# Patient Record
Sex: Female | Born: 1975 | Race: White | Hispanic: No | Marital: Single | Smoking: Former smoker
Health system: Southern US, Community
[De-identification: ages and names within clinical notes are randomized; demographics above are authoritative.]

## PROBLEM LIST (undated history)

## (undated) DIAGNOSIS — N979 Female infertility, unspecified: Secondary | ICD-10-CM

## (undated) DIAGNOSIS — E236 Other disorders of pituitary gland: Secondary | ICD-10-CM

## (undated) DIAGNOSIS — Z889 Allergy status to unspecified drugs, medicaments and biological substances status: Secondary | ICD-10-CM

## (undated) HISTORY — DX: Female infertility, unspecified: N97.9

## (undated) HISTORY — PX: ENDOMETRIAL ABLATION: SHX621

## (undated) HISTORY — DX: Other disorders of pituitary gland: E23.6

---

## 2007-07-31 LAB — CONVERTED CEMR LAB: Pap Smear: NORMAL

## 2008-06-06 ENCOUNTER — Ambulatory Visit: Payer: Self-pay | Admitting: Family Medicine

## 2008-06-06 DIAGNOSIS — F341 Dysthymic disorder: Secondary | ICD-10-CM | POA: Insufficient documentation

## 2010-09-18 ENCOUNTER — Encounter: Payer: Self-pay | Admitting: Family Medicine

## 2010-09-18 ENCOUNTER — Inpatient Hospital Stay (INDEPENDENT_AMBULATORY_CARE_PROVIDER_SITE_OTHER)
Admission: RE | Admit: 2010-09-18 | Discharge: 2010-09-18 | Disposition: A | Discharge status: Home / Self Care | Payer: PRIVATE HEALTH INSURANCE | Source: Ambulatory Visit | Attending: Family Medicine | Admitting: Family Medicine

## 2010-09-18 DIAGNOSIS — R42 Dizziness and giddiness: Secondary | ICD-10-CM

## 2010-09-18 DIAGNOSIS — H6990 Unspecified Eustachian tube disorder, unspecified ear: Secondary | ICD-10-CM

## 2010-09-18 DIAGNOSIS — R5381 Other malaise: Secondary | ICD-10-CM

## 2010-09-18 DIAGNOSIS — B009 Herpesviral infection, unspecified: Secondary | ICD-10-CM | POA: Insufficient documentation

## 2010-09-18 DIAGNOSIS — H6980 Other specified disorders of Eustachian tube, unspecified ear: Secondary | ICD-10-CM

## 2010-09-20 ENCOUNTER — Telehealth (INDEPENDENT_AMBULATORY_CARE_PROVIDER_SITE_OTHER): Payer: Self-pay | Admitting: Emergency Medicine

## 2010-10-30 ENCOUNTER — Encounter: Payer: Self-pay | Admitting: Family Medicine

## 2010-10-30 ENCOUNTER — Inpatient Hospital Stay (INDEPENDENT_AMBULATORY_CARE_PROVIDER_SITE_OTHER)
Admission: RE | Admit: 2010-10-30 | Discharge: 2010-10-30 | Disposition: A | Discharge status: Home / Self Care | Payer: PRIVATE HEALTH INSURANCE | Source: Ambulatory Visit | Attending: Family Medicine | Admitting: Family Medicine

## 2010-10-30 DIAGNOSIS — J069 Acute upper respiratory infection, unspecified: Secondary | ICD-10-CM

## 2011-02-01 NOTE — Telephone Encounter (Signed)
  Phone Note Outgoing Call   Call placed by: Lavell Islam RN,  September 20, 2010 4:26 PM Call placed to: Patient Action Taken: Phone Call Completed Summary of Call: Left message on voice mail inquiring about patient's status and encouraging her to call with any questions/concerns. Initial call taken by: Lavell Islam RN,  September 20, 2010 4:27 PM

## 2011-02-01 NOTE — Progress Notes (Signed)
Summary: EAR ACHE/CONGESTION   Vital Signs:  Patient Profile:   35 Years Old Female CC:      right ear pain, sinus pressure x 4 days Height:     70 inches Weight:      155 pounds O2 Sat:      99 % O2 treatment:    Room Air Temp:     98.4 degrees F oral Pulse rate:   65 / minute Resp:     12 per minute BP sitting:   109 / 75  (left arm) Cuff size:   regular  Vitals Entered By: Lajean Saver RN (September 18, 2010 9:27 AM)                  Updated Prior Medication List: PROVERA 2.5 MG TABS (MEDROXYPROGESTERONE ACETATE)   Current Allergies: No known allergies History of Present Illness Chief Complaint: right ear pain, sinus pressure x 4 days History of Present Illness:  Subjective: Patient complains of vague sensation of right ear being clogged for 3 days, associated with a mild sensation of being off-balance.  Symptoms have not improved with a Neti pot.  Also complains of recurrent cold sores for the past two days.  She has had a poor response in past to Acyclovir cream No sore throat No cough No pleuritic pain No wheezing + mild right nasal congestion No post-nasal drainage ? sinus pain/pressure No itchy/red eyes No earache No hemoptysis No SOB No fever/chills No nausea No vomiting No abdominal pain No diarrhea No skin rashes + fatigue + myalgias + arthralgias.  No history of tick bite. No headache    REVIEW OF SYSTEMS Constitutional Symptoms      Denies fever, chills, night sweats, weight loss, weight gain, and fatigue.  Eyes       Denies change in vision, eye pain, eye discharge, glasses, contact lenses, and eye surgery. Ear/Nose/Throat/Mouth       Complains of ear pain and sinus problems.      Denies hearing loss/aids, change in hearing, ear discharge, dizziness, frequent runny nose, frequent nose bleeds, sore throat, hoarseness, and tooth pain or bleeding.  Respiratory       Denies dry cough, productive cough, wheezing, shortness of breath, asthma,  bronchitis, and emphysema/COPD.  Cardiovascular       Denies murmurs, chest pain, and tires easily with exhertion.    Gastrointestinal       Denies stomach pain, nausea/vomiting, diarrhea, constipation, blood in bowel movements, and indigestion. Genitourniary       Denies painful urination, blood or discharge from vagina, kidney stones, and loss of urinary control. Neurological       Denies paralysis, seizures, and fainting/blackouts. Musculoskeletal       Denies muscle pain, joint pain, joint stiffness, decreased range of motion, redness, swelling, muscle weakness, and gout.  Skin       Denies bruising, unusual mles/lumps or sores, and hair/skin or nail changes.  Psych       Denies mood changes, temper/anger issues, anxiety/stress, speech problems, depression, and sleep problems. Other Comments: Patient c/o rigth ear pain, sinus pressure and feeling "swimmy headed" x 4 days. She has used neti pot, advil and claritin OTC.   Past History:  Past Medical History: Reviewed history from 06/06/2008 and no changes required. None  Past Surgical History: Reviewed history from 06/06/2008 and no changes required. None  Family History: Reviewed history from 06/06/2008 and no changes required. 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  Lives with her partner and her twin sister.  Denies smoking Alcohol use-yes Drug use-no Regular exercise-yes   Objective:  Appearance:  Patient appears healthy, stated age, and in no acute distress  Eyes:  Pupils are equal, round, and reactive to light and accomodation.  Extraocular movement is intact.  Conjunctivae are not inflamed.  Ears:  Canals normal.  Tympanic membranes normal.   Nose:  Mildly congested turbinates.  No sinus tenderness  Mouth::  Lower lips reveal early cold sore lesions;  moist mucous membranes  Pharynx:  Normal  Neck:  Supple.  Slightly tender shotty anterior/posterior nodes are palpated  bilaterally.  Lungs:  Clear to auscultation.  Breath sounds are equal.  Heart:  Regular rate and rhythm without murmurs, rubs, or gallops.  Abdomen:  Nontender without masses or hepatosplenomegaly.  Bowel sounds are present.  No CVA or flank tenderness.  Extremities:  No edema.   Skin:  No rash Tympanogram normal bilaterally CBC:  WBC 7.0 ; LY 33.5, MO 7.9, GR 58.6; Hgb 12.9  . 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) FATIGUE, ACUTE (ICD-780.79) DIZZINESS (ICD-780.4)  SUSPECT EARLY VIRAL URI WITH MILD VERTIGO, EUSTACHIAN TUBE DYSFUNCTION, AND HERPES LABIALIS  Plan New Medications/Changes: VALTREX 1 GM TABS (VALACYCLOVIR HCL) 2 tabs by mouth q12hr X 1 day at first sign of cold sores  #4 x 3, 09/18/2010, Donna Christen MD MECLIZINE HCL 25 MG TABS (MECLIZINE HCL) One by mouth two times a day to three times a day as needed for dizziness  #15 x 1, 09/18/2010, Donna Christen MD  New Orders: CBC w/Diff [[address]-60454] Tympanometry [09811] New Patient Level IV [91478] Planning Comments:   Treat symptomatically for now:  rest, increased fluids, topical decongestant, Antivert as needed.  Continue saline nasal irrigation with a Neti pot. Valtrex for one day. Follow-up with PCP if not improving 5 to 7 days.   The patient and/or caregiver has been counseled thoroughly with regard to medications prescribed including dosage, schedule, interactions, rationale for use, and possible side effects and they verbalize understanding.  Diagnoses and expected course of recovery discussed and will return if not improved as expected or if the condition worsens. Patient and/or caregiver verbalized understanding.  Prescriptions: VALTREX 1 GM TABS (VALACYCLOVIR HCL) 2 tabs by mouth q12hr X 1 day at first sign of cold sores  #4 x 3   Entered and Authorized by:   Donna Christen MD   Signed by:   Donna Christen MD on 09/18/2010   Method used:   Print then Give  to Patient   RxID:   [id number] MECLIZINE HCL 25 MG TABS (MECLIZINE HCL) One by mouth two times a day to three times a day as needed for dizziness  #15 x 1   Entered and Authorized by:   Donna Christen MD   Signed by:   Donna Christen MD on 09/18/2010   Method used:   Print then Give to Patient   RxID:   [id number]   Orders Added: 1)  CBC w/Diff [72536-64403] 2)  Tympanometry [47425] 3)  New Patient Level IV [95638]

## 2011-02-01 NOTE — Progress Notes (Signed)
Summary: Possible URI and ear pain Room 5   Vital Signs:  Patient Profile:   35 Years Old Female CC:      Congestion, productive cough headache, sore throat x 2 days Height:     70 inches Weight:      159 pounds O2 Sat:      99 % O2 treatment:    Room Air Temp:     99.1 degrees F oral Pulse rate:   62 / minute Pulse rhythm:   regular Resp:     14 per minute BP sitting:   132 / 76  (left arm) Cuff size:   regular  Vitals Entered By: Emilio Math (October 30, 2010 11:34 AM)                  Current Allergies: No known allergies History of Present Illness Chief Complaint: Congestion, productive cough headache, sore throat x 2 days History of Present Illness:  Subjective: Patient complains of URI symptoms for 3 to 4 days. + mild sore throat + cough No pleuritic pain No wheezing + nasal congestion ? post-nasal drainage No sinus pain/pressure No itchy/red eyes No earache No hemoptysis No SOB No fever/chills No nausea No vomiting No abdominal pain No diarrhea No skin rashes + fatigue No myalgias + headache     Current Meds PROVERA 2.5 MG TABS (MEDROXYPROGESTERONE ACETATE)  ZICAM COLD REMEDY  TBDP (HOMEOPATHIC PRODUCTS)  PREDNISONE 10 MG TABS (PREDNISONE) 2 PO BID for 2 days, then 1 BID for 2 days, then 1 daily for 2 days.  Take PC AZITHROMYCIN 250 MG TABS (AZITHROMYCIN) Two tabs by mouth on day 1, then 1 tab daily on days 2 through 5 (Rx void after 11/06/10) BENZONATATE 200 MG CAPS (BENZONATATE) One by mouth hs as needed cough  REVIEW OF SYSTEMS Constitutional Symptoms      Denies fever, chills, night sweats, weight loss, weight gain, and fatigue.  Eyes       Denies change in vision, eye pain, eye discharge, glasses, contact lenses, and eye surgery. Ear/Nose/Throat/Mouth       Complains of frequent runny nose, sinus problems, sore throat, and hoarseness.      Denies hearing loss/aids, change in hearing, ear pain, ear discharge, dizziness, frequent nose  bleeds, and tooth pain or bleeding.  Respiratory       Complains of productive cough.      Denies dry cough, wheezing, shortness of breath, asthma, bronchitis, and emphysema/COPD.  Cardiovascular       Denies murmurs, chest pain, and tires easily with exhertion.    Gastrointestinal       Denies stomach pain, nausea/vomiting, diarrhea, constipation, blood in bowel movements, and indigestion. Genitourniary       Denies painful urination, kidney stones, and loss of urinary control. Neurological       Complains of headaches.      Denies paralysis, seizures, and fainting/blackouts. Musculoskeletal       Denies muscle pain, joint pain, joint stiffness, decreased range of motion, redness, swelling, muscle weakness, and gout.  Skin       Denies bruising, unusual mles/lumps or sores, and hair/skin or nail changes.  Psych       Denies mood changes, temper/anger issues, anxiety/stress, speech problems, depression, and sleep problems.  Past History:  Past Medical History: Reviewed history from 06/06/2008 and no changes required. None  Past Surgical History: Reviewed history from 06/06/2008 and no changes required. None  Family History: Reviewed history from 06/06/2008 and no  changes required. None  Social History: Reviewed history from 09/18/2010 and no changes required. 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  Lives with her partner and her twin sister.  Denies smoking Alcohol use-yes Drug use-no Regular exercise-yes   Objective:  Appearance:  Patient appears healthy, stated age, and in no acute distress  Eyes:  Pupils are equal, round, and reactive to light and accomodation.  Extraocular movement is intact.  Conjunctivae are not inflamed.  Ears:  Canals normal.  Tympanic membranes normal.   Nose:  Mildly congested turbinates.  No sinus tenderness  Pharynx:  Normal  Neck:  Supple.  Slightly tender shotty posterior nodes are palpated bilaterally.  Lungs:  Clear to  auscultation.  Breath sounds are equal.  Heart:  Regular rate and rhythm without murmurs, rubs, or gallops.  Abdomen:  Nontender without masses or hepatosplenomegaly.  Bowel sounds are present.  No CVA or flank tenderness.  Skin:  No rash Assessment New Problems: UPPER RESPIRATORY INFECTION, ACUTE (ICD-465.9)   Plan New Medications/Changes: BENZONATATE 200 MG CAPS (BENZONATATE) One by mouth hs as needed cough  #12 x 0, 10/30/2010, Donna Christen MD AZITHROMYCIN 250 MG TABS (AZITHROMYCIN) Two tabs by mouth on day 1, then 1 tab daily on days 2 through 5 (Rx void after 11/06/10)  #6 tabs x 0, 10/30/2010, Donna Christen MD PREDNISONE 10 MG TABS (PREDNISONE) 2 PO BID for 2 days, then 1 BID for 2 days, then 1 daily for 2 days.  Take PC  #14 x 0, 10/30/2010, Donna Christen MD  New Orders: Pulse Oximetry (single measurment) [94760] Est. Patient Level III [40981] Planning Comments:   Treat symptomatically for now:  Increase fluid intake, begin expectorant/decongestant, topical decongestant,  cough suppressant at bedtime.  If fever/chills/sweats persist, or if not improving 5  days begin Z-pack (given Rx to hold).  Followup with PCP if not improving 7 to 10 days.   The patient and/or caregiver has been counseled thoroughly with regard to medications prescribed including dosage, schedule, interactions, rationale for use, and possible side effects and they verbalize understanding.  Diagnoses and expected course of recovery discussed and will return if not improved as expected or if the condition worsens. Patient and/or caregiver verbalized understanding.  Prescriptions: BENZONATATE 200 MG CAPS (BENZONATATE) One by mouth hs as needed cough  #12 x 0   Entered and Authorized by:   Donna Christen MD   Signed by:   Donna Christen MD on 10/30/2010   Method used:   Print then Give to Patient   RxID:   [id number] AZITHROMYCIN 250 MG TABS (AZITHROMYCIN) Two tabs by mouth on day 1, then 1 tab daily on days 2  through 5 (Rx void after 11/06/10)  #6 tabs x 0   Entered and Authorized by:   Donna Christen MD   Signed by:   Donna Christen MD on 10/30/2010   Method used:   Print then Give to Patient   RxID:   [id number] PREDNISONE 10 MG TABS (PREDNISONE) 2 PO BID for 2 days, then 1 BID for 2 days, then 1 daily for 2 days.  Take PC  #14 x 0   Entered and Authorized by:   Donna Christen MD   Signed by:   Donna Christen MD on 10/30/2010   Method used:   Print then Give to Patient   RxID:   [id number]   Patient Instructions: 1)  Take Mucinex D (guaifenesin with decongestant) twice daily for congestion. 2)  Increase fluid intake,  rest. 3)  May use Afrin nasal spray (or generic oxymetazoline) twice daily for about 5 days.  Also recommend using saline nasal spray several times daily and/or saline nasal irrigation. 4)  Begin Azithromycin if not improving about 5 days or if persistent fever develops. 5)  Followup with family doctor if not improving 7 to 10 days.   Orders Added: 1)  Pulse Oximetry (single measurment) [94760] 2)  Est. Patient Level III [[address]]

## 2011-02-01 NOTE — Letter (Signed)
Summary: Out of Work  [facility]  [address]   [place], [place] [address]   Phone: [phone]  Fax: [phone]    September 18, 2010   Employee:  NICOSHA STRUVE    To Whom It May Concern:   For Medical reasons, please excuse the above named employee from work 09/19/10 and 09/20/10.    If you need additional information, please feel free to contact our office.         Sincerely,    Donna Christen MD

## 2011-02-24 ENCOUNTER — Emergency Department
Admission: EM | Admit: 2011-02-24 | Discharge: 2011-02-24 | Disposition: A | Discharge status: Home / Self Care | Payer: PRIVATE HEALTH INSURANCE | Source: Home / Self Care | Attending: Emergency Medicine | Admitting: Emergency Medicine

## 2011-02-24 ENCOUNTER — Encounter: Payer: Self-pay | Admitting: Emergency Medicine

## 2011-02-24 DIAGNOSIS — A09 Infectious gastroenteritis and colitis, unspecified: Secondary | ICD-10-CM

## 2011-02-24 MED ORDER — CIPROFLOXACIN HCL 250 MG PO TABS: 250.0000 mg | ORAL_TABLET | Freq: Two times a day (BID) | ORAL | Status: AC

## 2011-02-24 NOTE — ED Provider Notes (Signed)
History     CSN: [id number]  Arrival date & time 02/24/11  1238   First MD Initiated Contact with Patient 02/24/11 1434      Chief Complaint  Patient presents with  . Generalized Body Aches     Patient is a 35 y.o. female presenting with diarrhea. The history is provided by the patient.  Diarrhea The primary symptoms include fatigue, nausea and diarrhea (4-6 times a day, some mucus in the diarrhea, but no blood). Primary symptoms do not include fever, weight loss, vomiting, melena, hematemesis, jaundice, hematochezia, dysuria, myalgias, arthralgias or rash. Abdominal pain: Minimal, generalized. The illness began 3 to 5 days ago. The onset was gradual. The problem has been gradually worsening.  The illness is also significant for anorexia. The illness does not include chills, dysphagia, odynophagia, bloating, constipation, tenesmus, back pain or itching. Associated medical issues do not include inflammatory bowel disease, GERD, gallstones, liver disease, alcohol abuse, PUD, bowel resection, irritable bowel syndrome or diverticulitis. Risk factors: No recent travel out of the state, that has eaten various foods during the holiday season.    History reviewed. No pertinent past medical history.  No past surgical history on file.  No family history on file.  History  Substance Use Topics  . Smoking status: Not on file  . Smokeless tobacco: Not on file  . Alcohol Use: Not on file    OB History    Grav Para Term Preterm Abortions TAB SAB Ect Mult Living                  Review of Systems  Constitutional: Positive for fatigue. Negative for fever, chills and weight loss.  HENT: Negative.   Eyes: Negative.   Respiratory: Negative.   Cardiovascular: Negative.   Gastrointestinal: Positive for nausea, diarrhea (4-6 times a day, some mucus in the diarrhea, but no blood) and anorexia. Negative for dysphagia, vomiting, constipation, melena, hematochezia, bloating, hematemesis and  jaundice. Abdominal pain: Minimal, generalized.  Genitourinary: Negative.  Negative for dysuria.  Musculoskeletal: Negative for myalgias, back pain and arthralgias.  Skin: Negative.  Negative for itching and rash.  Neurological: Negative for tremors, syncope, weakness, light-headedness and headaches.  Hematological: Negative.   Psychiatric/Behavioral: Negative.     Allergies  Review of patient's allergies indicates no known allergies.  Home Medications   Current Outpatient Rx  Name Route Sig Dispense Refill  . MEDROXYPROGESTERONE ACETATE 10 MG PO TABS Oral Take 10 mg by mouth daily.      Marland Kitchen CIPROFLOXACIN HCL 250 MG PO TABS Oral Take 1 tablet (250 mg total) by mouth 2 (two) times daily. 14 tablet 0    BP 123/83  Pulse 63  Temp(Src) 98.6 F (37 C) (Oral)  Resp 12  Ht 5\' 7"  (1.702 m)  Wt 159 lb (72.122 kg)  BMI 24.90 kg/m2  SpO2 99%  Physical Exam  Nursing note and vitals reviewed. Constitutional: She is oriented to person, place, and time. She appears well-developed and well-nourished. No distress.       Appears fatigued, but not toxic appearing  HENT:  Head: Normocephalic and atraumatic.  Eyes: Conjunctivae and EOM are normal. Pupils are equal, round, and reactive to light. No scleral icterus.  Neck: Normal range of motion. Neck supple. No JVD present.  Cardiovascular: Normal rate and regular rhythm.  Exam reveals no gallop and no friction rub.   No murmur heard. Pulmonary/Chest: Effort normal and breath sounds normal. No respiratory distress. She has no wheezes. She has no  rales.  Abdominal: Normal appearance. She exhibits no shifting dullness, no distension, no pulsatile liver, no fluid wave, no abdominal bruit, no ascites and no mass. Bowel sounds are increased. There is no hepatosplenomegaly. There is tenderness (Very minimal generalized tenderness). There is no rigidity, no rebound, no guarding, no CVA tenderness, no tenderness at McBurney's point and negative Murphy's  sign.  Musculoskeletal: Normal range of motion. She exhibits no edema.  Lymphadenopathy:    She has no cervical adenopathy.  Neurological: She is alert and oriented to person, place, and time.  Skin: Skin is warm. No rash noted.  Psychiatric: She has a normal mood and affect. Her behavior is normal.    ED Course  Procedures (including critical care time)  Labs Reviewed - No data to display No results found.   1. Gastroenteritis, infectious       MDM  Discussed workup and treatment options. She declined any testing at this time. We'll cover bacterial causes for the diarrhea, prescribed Cipro 500 mg twice a day for 7 days. Bland diet. Avoid dairy for now. Push clear liquids. She declined prescription for antispasmodic such as Levsin. Followup here or with PCP within 1 week if not better, sooner if worsening symptoms. She voiced understanding and agreement. See detailed Instructions in AVS, which were given to patient. Verbal instructions also given. Questions invited and answered.        Lonell Face, MD 02/24/11 [phone]

## 2011-02-24 NOTE — ED Notes (Signed)
Body aches, diarrhea x 6, headache, congestion x 3 days

## 2011-04-02 ENCOUNTER — Encounter (INDEPENDENT_AMBULATORY_CARE_PROVIDER_SITE_OTHER): Payer: PRIVATE HEALTH INSURANCE | Admitting: Ophthalmology

## 2011-04-02 DIAGNOSIS — H33309 Unspecified retinal break, unspecified eye: Secondary | ICD-10-CM

## 2011-04-02 DIAGNOSIS — H43819 Vitreous degeneration, unspecified eye: Secondary | ICD-10-CM

## 2011-04-02 DIAGNOSIS — H5210 Myopia, unspecified eye: Secondary | ICD-10-CM

## 2011-04-02 DIAGNOSIS — H3581 Retinal edema: Secondary | ICD-10-CM

## 2011-04-09 ENCOUNTER — Encounter (INDEPENDENT_AMBULATORY_CARE_PROVIDER_SITE_OTHER): Payer: PRIVATE HEALTH INSURANCE | Admitting: Ophthalmology

## 2011-04-09 DIAGNOSIS — H33309 Unspecified retinal break, unspecified eye: Secondary | ICD-10-CM

## 2011-04-21 ENCOUNTER — Ambulatory Visit (INDEPENDENT_AMBULATORY_CARE_PROVIDER_SITE_OTHER): Payer: PRIVATE HEALTH INSURANCE | Admitting: Ophthalmology

## 2011-04-21 DIAGNOSIS — H33309 Unspecified retinal break, unspecified eye: Secondary | ICD-10-CM

## 2011-05-13 HISTORY — PX: LASIK: SHX215

## 2011-08-20 ENCOUNTER — Ambulatory Visit (INDEPENDENT_AMBULATORY_CARE_PROVIDER_SITE_OTHER): Payer: PRIVATE HEALTH INSURANCE | Admitting: Ophthalmology

## 2011-08-20 DIAGNOSIS — H2510 Age-related nuclear cataract, unspecified eye: Secondary | ICD-10-CM

## 2011-08-20 DIAGNOSIS — H33309 Unspecified retinal break, unspecified eye: Secondary | ICD-10-CM

## 2011-08-20 DIAGNOSIS — H43819 Vitreous degeneration, unspecified eye: Secondary | ICD-10-CM

## 2011-08-20 DIAGNOSIS — H5210 Myopia, unspecified eye: Secondary | ICD-10-CM

## 2012-08-21 ENCOUNTER — Ambulatory Visit (INDEPENDENT_AMBULATORY_CARE_PROVIDER_SITE_OTHER): Payer: PRIVATE HEALTH INSURANCE | Admitting: Ophthalmology

## 2012-08-21 DIAGNOSIS — H33309 Unspecified retinal break, unspecified eye: Secondary | ICD-10-CM

## 2012-08-21 DIAGNOSIS — H35419 Lattice degeneration of retina, unspecified eye: Secondary | ICD-10-CM

## 2012-08-21 DIAGNOSIS — H43819 Vitreous degeneration, unspecified eye: Secondary | ICD-10-CM

## 2012-10-31 ENCOUNTER — Encounter: Payer: Self-pay | Admitting: *Deleted

## 2012-10-31 ENCOUNTER — Emergency Department
Admission: EM | Admit: 2012-10-31 | Discharge: 2012-10-31 | Disposition: A | Discharge status: Home / Self Care | Payer: PRIVATE HEALTH INSURANCE | Source: Home / Self Care | Attending: Family Medicine | Admitting: Family Medicine

## 2012-10-31 ENCOUNTER — Emergency Department (INDEPENDENT_AMBULATORY_CARE_PROVIDER_SITE_OTHER): Payer: PRIVATE HEALTH INSURANCE

## 2012-10-31 DIAGNOSIS — K5732 Diverticulitis of large intestine without perforation or abscess without bleeding: Secondary | ICD-10-CM

## 2012-10-31 DIAGNOSIS — R109 Unspecified abdominal pain: Secondary | ICD-10-CM

## 2012-10-31 DIAGNOSIS — R10814 Left lower quadrant abdominal tenderness: Secondary | ICD-10-CM

## 2012-10-31 DIAGNOSIS — R197 Diarrhea, unspecified: Secondary | ICD-10-CM

## 2012-10-31 DIAGNOSIS — R110 Nausea: Secondary | ICD-10-CM

## 2012-10-31 DIAGNOSIS — R1032 Left lower quadrant pain: Secondary | ICD-10-CM

## 2012-10-31 LAB — POCT CBC W AUTO DIFF (K'VILLE URGENT CARE)

## 2012-10-31 LAB — POCT URINALYSIS DIP (MANUAL ENTRY)
Ketones, POC UA: NEGATIVE
Protein Ur, POC: NEGATIVE
Spec Grav, UA: 1.02 (ref 1.005–1.03)
Urobilinogen, UA: 0.2 (ref 0–1)
pH, UA: 7 (ref 5–8)

## 2012-10-31 MED ORDER — IOHEXOL 300 MG/ML  SOLN
100.0000 mL | Freq: Once | INTRAMUSCULAR | Status: AC | PRN
  Administered 2012-10-31: 100 mL via INTRAVENOUS

## 2012-10-31 MED ORDER — METRONIDAZOLE 500 MG PO TABS: 500.0000 mg | ORAL_TABLET | Freq: Three times a day (TID) | ORAL | Status: DC

## 2012-10-31 MED ORDER — CIPROFLOXACIN HCL 500 MG PO TABS: 500.0000 mg | ORAL_TABLET | Freq: Two times a day (BID) | ORAL | Status: DC

## 2012-10-31 NOTE — ED Notes (Signed)
Pt c/o diarrhea, nausea and mid epigastric pain x 2 days. Denies vomiting. Denies fever. She reports taking tylenol and aleve x 3wks for AC strain.

## 2012-10-31 NOTE — ED Provider Notes (Addendum)
CSN: [id number]     Arrival date & time 10/31/12  1041 History   First MD Initiated Contact with Patient 10/31/12 1042     Chief Complaint  Patient presents with  . Diarrhea  . Nausea    HPI  Diarrhea: Onset: 3-4 days  Description: Pt states that [place] the beach when she noticed sudden onset of epigastric pain, nausea and diarrhea. Pt states that she has been taking high dose NSAIDs for shoulder injury. No fevers or chills. Nausea, diarrhea, epigastric pain mainly after eating. Diarrhea NBNB. Pt states that she has 3-5 BMs after eating. No prior hx/o GB or stomach issues in the past. No other sick contacts. Epigastric pain is 4-5/10 at its worst. No radiation.    Modifying factors: none   Symptoms: Incontinence: no Vomiting: no Abdominal Pain: yes Urgency: no Relief with defecation: no Weight loss: no Decreased urine output: no Lightheadedness: no Recent travel history: yes; sxs occurred at the beach  Sick contacts: no Suspicious food or water: unsure  Change in diet: no    Red Flags: Fever: no Bloody stools: no Recent antibiotics: no Immuncompromised: no   History reviewed. No pertinent past medical history. History reviewed. No pertinent past surgical history. Family History  Problem Relation Age of Onset  . Hypertension Father    History  Substance Use Topics  . Smoking status: Current Some Day Smoker  . Smokeless tobacco: Not on file  . Alcohol Use: Yes     Comment: 2-3 per wk   OB History   Grav Para Term Preterm Abortions TAB SAB Ect Mult Living                 Review of Systems  All other systems reviewed and are negative.    Allergies  Review of patient's allergies indicates no known allergies.  Home Medications   Current Outpatient Rx  Name  Route  Sig  Dispense  Refill  . acetaminophen (TYLENOL) 325 MG tablet   Oral   Take 650 mg by mouth every 6 (six) hours as needed for pain.         . naproxen sodium (ANAPROX) 220 MG tablet    Oral   Take 220 mg by mouth 2 (two) times daily with a meal.         . medroxyPROGESTERone (PROVERA) 10 MG tablet   Oral   Take 10 mg by mouth daily.            BP 121/82  Pulse 76  Temp(Src) 98.1 F (36.7 C) (Oral)  Resp 16  Wt 150 lb (68.04 kg)  BMI 23.49 kg/m2  SpO2 98%  LMP 10/23/2012 Physical Exam  Constitutional: She appears well-developed and well-nourished.  HENT:  Head: Normocephalic and atraumatic.  Eyes: Conjunctivae are normal. Pupils are equal, round, and reactive to light.  Neck: Normal range of motion.  Cardiovascular: Normal rate, regular rhythm and normal heart sounds.   Pulmonary/Chest: Effort normal.  Abdominal:  + TTP in epigastric region Marked TTP in RLQ with guarding and tearfulness upon palpation.    Musculoskeletal: Normal range of motion.  Neurological: She is alert.  Skin: Skin is warm.    ED Course  Procedures (including critical care time) Labs Review Labs Reviewed  POCT CBC W AUTO DIFF ([facility])  POCT URINALYSIS DIP (MANUAL ENTRY)   Imaging Review No results found.  MDM   1. Abdominal  pain, other specified site   2. Diarrhea  Ddx for sxs is fairly broad including gastritis, cholecystitis, colitis, gastroenteritis, GYN pathology.  Given that pt has marked RLQ TTP, will send pt for CT abd and pelvis with IV and oral contrast to better assess anatomy. No leukocytosis on CBC. Upreg negative. UA WNL. Afebrile.  Will follow up pending CT abd and pelvis. Pt expressed understanding of plan.     The patient and/or caregiver has been counseled thoroughly with regard to treatment plan and/or medications prescribed including dosage, schedule, interactions, rationale for use, and possible side effects and they verbalize understanding. Diagnoses and expected course of recovery discussed and will return if not improved as expected or if the condition worsens. Patient and/or caregiver verbalized understanding.          Doree Albee, MD 10/31/12 1213  Ct Abdomen Pelvis W Contrast  10/31/2012   CLINICAL DATA:  Left lower quadrant abdominal pain and tenderness. Nausea and diarrhea.  EXAM: CT ABDOMEN AND PELVIS WITH CONTRAST  TECHNIQUE: Multidetector CT imaging of the abdomen and pelvis was performed using the standard protocol following bolus administration of intravenous contrast.  CONTRAST:  OMNIPAQUE IOHEXOL 300 MG/ML  SOLN  COMPARISON:  None.  FINDINGS: The liver, spleen, pancreas, and adrenal glands appear unremarkable. Gallbladder unremarkable. Appendix normal.  Mild but abnormal appearing colonic wall thickening in the ascending, transverse, and descending colon observed. Mesenteric vessels patent proximally. No pneumatosis or colon distention. There is sigmoid diverticulosis but without compelling findings of active diverticulitis. Uterus and adnexa unremarkable. Urinary bladder normal. No pathologic adenopathy. No free pelvic fluid.  Bone island noted in the left pubic body.  IMPRESSION: 1. Mild colon wall thickening involving the ascending, transverse, and descending colon, suspicious for colitis. Infectious colitis is favored over inflammatory bowel disease. No specific indicators of ischemic colitis. If the patient has had recent antibiotic usage and testing the stool for C difficile toxin may be warranted. 2. Sigmoid diverticulosis without active diverticulitis.   Electronically Signed   By: Herbie Baltimore   On: 10/31/2012 12:40   Results on CT consistent with infectious colitis.  Will place on cipro and flagyl for coverage.  C diff PCR order placed.  Discussed infectious and GI red flags.  Follow up with PCP in 5-7 days.     The patient and/or caregiver has been counseled thoroughly with regard to treatment plan and/or medications prescribed including dosage, schedule, interactions, rationale for use, and possible side effects and they verbalize understanding. Diagnoses and expected  course of recovery discussed and will return if not improved as expected or if the condition worsens. Patient and/or caregiver verbalized understanding.        Doree Albee, MD 10/31/12 1254

## 2012-11-07 ENCOUNTER — Telehealth: Payer: Self-pay | Admitting: *Deleted

## 2013-02-09 ENCOUNTER — Emergency Department
Admission: EM | Admit: 2013-02-09 | Discharge: 2013-02-09 | Disposition: A | Discharge status: Home / Self Care | Payer: PRIVATE HEALTH INSURANCE | Source: Home / Self Care | Attending: Emergency Medicine | Admitting: Emergency Medicine

## 2013-02-09 ENCOUNTER — Encounter: Payer: Self-pay | Admitting: Emergency Medicine

## 2013-02-09 DIAGNOSIS — J020 Streptococcal pharyngitis: Secondary | ICD-10-CM

## 2013-02-09 DIAGNOSIS — J029 Acute pharyngitis, unspecified: Secondary | ICD-10-CM

## 2013-02-09 LAB — POCT RAPID STREP A (OFFICE): Rapid Strep A Screen: NEGATIVE

## 2013-02-09 NOTE — ED Provider Notes (Signed)
CSN: [id number]     Arrival date & time 02/09/13  1478 History   First MD Initiated Contact with Patient 02/09/13 [phone]     Chief Complaint  Patient presents with  . Sore Throat  . Otalgia   (Consider location/radiation/quality/duration/timing/severity/associated sxs/prior Treatment) HPI Emily Herring is a 37 y.o. female who complains of onset of cold symptoms for 3 days.  The symptoms are constant and mild-moderate in severity.  Taking Alka-Seltzer Mucinex which is helping a little bit.  She already had the flu shot this year. + sore throat No cough No pleuritic pain No wheezing No nasal congestion No post-nasal drainage No sinus pain/pressure No chest congestion No itchy/red eyes + R earache No hemoptysis No SOB + chills/sweats No fever No nausea No vomiting No abdominal pain No diarrhea No skin rashes + headache     History reviewed. No pertinent past medical history. History reviewed. No pertinent past surgical history. Family History  Problem Relation Age of Onset  . Hypertension Father    History  Substance Use Topics  . Smoking status: Former Games developer  . Smokeless tobacco: Current User     Comment: e-cig  . Alcohol Use: Yes     Comment: 2-3 per wk   OB History   Grav Para Term Preterm Abortions TAB SAB Ect Mult Living                 Review of Systems  All other systems reviewed and are negative.    Allergies  Review of patient's allergies indicates no known allergies.  Home Medications   Current Outpatient Rx  Name  Route  Sig  Dispense  Refill  . medroxyPROGESTERone (PROVERA) 10 MG tablet   Oral   Take 10 mg by mouth daily.           . naproxen sodium (ANAPROX) 220 MG tablet   Oral   Take 220 mg by mouth 2 (two) times daily with a meal.          BP 120/85  Pulse 89  Temp(Src) 98.2 F (36.8 C) (Oral)  Resp 14  Ht 5\' 10"  (1.778 m)  Wt 152 lb (68.947 kg)  BMI 21.81 kg/m2  SpO2 99% Physical Exam  Nursing note and vitals  reviewed. Constitutional: She is oriented to person, place, and time. She appears well-developed and well-nourished.  HENT:  Head: Normocephalic and atraumatic.  Right Ear: Tympanic membrane, external ear and ear canal normal. Tympanic membrane is not erythematous.  Left Ear: Tympanic membrane, external ear and ear canal normal. Tympanic membrane is not erythematous.  Nose: Mucosal edema present.  Mouth/Throat: No oropharyngeal exudate, posterior oropharyngeal edema or posterior oropharyngeal erythema.  Mild clear postnasal drip  Eyes: No scleral icterus.  Neck: Neck supple.  Cardiovascular: Regular rhythm and normal heart sounds.   Pulmonary/Chest: Effort normal and breath sounds normal. No respiratory distress.  Neurological: She is alert and oriented to person, place, and time.  Skin: Skin is warm and dry.  Psychiatric: She has a normal mood and affect. Her speech is normal.    ED Course  Procedures (including critical care time) Labs Review Labs Reviewed  STREP A DNA PROBE  POCT RAPID STREP A (OFFICE)   Imaging Review No results found.  EKG Interpretation    Date/Time:    Ventricular Rate:    PR Interval:    QRS Duration:   QT Interval:    QTC Calculation:   R Axis:     Text Interpretation:  MDM   1. Acute pharyngitis    1)  currently likely viral syndrome.  Rapid strep is negative.  Culture is pending.  No antibiotics were given today.  If her sore throat progresses and gets worse or she is having increased right ear pain, she can call back and we can call in an antibiotic at that time. 2)  Use nasal saline solution (over the counter) at least 3 times a day. 3)  Use over the counter decongestants like Zyrtec-D every 12 hours as needed to help with congestion.  If you have hypertension, do not take medicines with sudafed.  4)  Can take tylenol every 6 hours or motrin every 8 hours for pain or fever. 5)  Follow up with your primary doctor if no  improvement in 5-7 days, sooner if increasing pain, fever, or new symptoms.     Marlaine Hind, MD 02/09/13 [phone]

## 2013-02-09 NOTE — ED Notes (Signed)
Emily Herring c/o sore throat, right ear pain, HA and night sweats x 3 days. She has received a flu vaccine this year.

## 2013-02-10 LAB — STREP A DNA PROBE: GASP: NEGATIVE

## 2013-02-11 ENCOUNTER — Telehealth: Payer: Self-pay

## 2013-05-23 LAB — HM PAP SMEAR: HM PAP: NEGATIVE

## 2013-09-25 ENCOUNTER — Emergency Department
Admission: EM | Admit: 2013-09-25 | Discharge: 2013-09-25 | Disposition: A | Discharge status: Home / Self Care | Payer: PRIVATE HEALTH INSURANCE | Source: Home / Self Care | Attending: Emergency Medicine | Admitting: Emergency Medicine

## 2013-09-25 ENCOUNTER — Encounter: Payer: Self-pay | Admitting: Emergency Medicine

## 2013-09-25 DIAGNOSIS — J328 Other chronic sinusitis: Secondary | ICD-10-CM

## 2013-09-25 HISTORY — DX: Allergy status to unspecified drugs, medicaments and biological substances: Z88.9

## 2013-09-25 MED ORDER — FLUTICASONE PROPIONATE 50 MCG/ACT NA SUSP: 2.0000 | Freq: Every day | NASAL | Status: DC

## 2013-09-25 MED ORDER — CLARITHROMYCIN 500 MG PO TABS: 500.0000 mg | ORAL_TABLET | Freq: Two times a day (BID) | ORAL | Status: DC

## 2013-09-25 NOTE — ED Provider Notes (Signed)
CSN: [id number]     Arrival date & time 09/25/13  1221 History   First MD Initiated Contact with Patient 09/25/13 1245     Chief Complaint  Patient presents with  . Nasal Congestion  . Facial Pain   (Consider location/radiation/quality/duration/timing/severity/associated sxs/prior Treatment) HPI Emily Herring is a 38 y.o. female who complains of onset of cold symptoms for 3 months.  The symptoms are constant and mild-moderate in severity.  Has a history of allergies, used to be on Flonase years ago, but no recent use or need for use.  Has tried OTC claritin with no success.  Other than sinus pressure on R side, no other symptoms.  History of broken nose & deviated septum. No sore throat No cough No pleuritic pain No wheezing No nasal congestion No post-nasal drainage + R sided sinus pain/pressure No chest congestion No itchy/red eyes No earache No hemoptysis No SOB No chills/sweats No fever No nausea No vomiting No abdominal pain No diarrhea No skin rashes No fatigue No myalgias   Past Medical History  Diagnosis Date  . H/O seasonal allergies    History reviewed. No pertinent past surgical history. Family History  Problem Relation Age of Onset  . Hypertension Father    History  Substance Use Topics  . Smoking status: Former Research scientist (life sciences)  . Smokeless tobacco: Current User     Comment: e-cig  . Alcohol Use: Yes     Comment: 2-3 per wk   OB History   Grav Para Term Preterm Abortions TAB SAB Ect Mult Living                 Review of Systems  All other systems reviewed and are negative.   Allergies  Review of patient's allergies indicates no known allergies.  Home Medications   Prior to Admission medications   Medication Sig Start Date End Date Taking? Authorizing Provider  loratadine (CLARITIN) 10 MG tablet Take 10 mg by mouth daily.   Yes Historical Provider, MD  clarithromycin (BIAXIN) 500 MG tablet Take 1 tablet (500 mg total) by mouth 2 (two) times daily. 09/25/13    Janeann Forehand, MD  fluticasone (FLONASE) 50 MCG/ACT nasal spray Place 2 sprays into both nostrils daily. 09/25/13   Janeann Forehand, MD  medroxyPROGESTERone (PROVERA) 10 MG tablet Take 10 mg by mouth daily.      Historical Provider, MD  naproxen sodium (ANAPROX) 220 MG tablet Take 220 mg by mouth 2 (two) times daily with a meal.    Historical Provider, MD   BP 116/76  Pulse 50  Temp(Src) 97.7 F (36.5 C) (Oral)  Resp 16  Ht 5' 9"  (1.753 m)  Wt 154 lb (69.854 kg)  BMI 22.73 kg/m2  SpO2 99%  LMP 09/08/2013 Physical Exam  Nursing note and vitals reviewed. Constitutional: She is oriented to person, place, and time. She appears well-developed and well-nourished.  Non-toxic appearance. She does not appear ill.  HENT:  Head: Normocephalic and atraumatic.  Right Ear: Tympanic membrane, external ear and ear canal normal.  Left Ear: Tympanic membrane, external ear and ear canal normal.  Nose: Right sinus exhibits maxillary sinus tenderness.  Mouth/Throat: No oropharyngeal exudate, posterior oropharyngeal edema or posterior oropharyngeal erythema.  Eyes: No scleral icterus.  Neck: Neck supple.  Cardiovascular: Regular rhythm and normal heart sounds.   Pulmonary/Chest: Effort normal and breath sounds normal. No respiratory distress. She has no decreased breath sounds. She has no wheezes.  Neurological: She is alert and oriented to person,  place, and time.  Skin: Skin is warm and dry.  Psychiatric: She has a normal mood and affect. Her speech is normal.    ED Course  Procedures (including critical care time) Labs Review Labs Reviewed - No data to display  Imaging Review No results found.   MDM   1. Other chronic sinusitis    1)  Sounds like a chronic maxillary sinusitis secondary to deviated septum.  Will treat with 10 days Biaxin + Flonase + OTC claritin.  If not improved, patient already has appt with ENT. 2)  Use nasal saline solution (over the counter) at least 3 times  a day. 3)  Use over the counter decongestants like Zyrtec-D every 12 hours as needed to help with congestion.  If you have hypertension, do not take medicines with sudafed.  4)  Can take tylenol every 6 hours or motrin every 8 hours for pain or fever. 5)  Follow up with your primary doctor if no improvement in 5-7 days, sooner if increasing pain, fever, or new symptoms.     Janeann Forehand, MD 09/25/13 1253

## 2013-09-25 NOTE — ED Notes (Signed)
Reports 3 month history of sinus congestion; tried OTC for sinus allergies without relief; states area around right eye and forehead most uncomfortable.

## 2013-10-11 ENCOUNTER — Ambulatory Visit (INDEPENDENT_AMBULATORY_CARE_PROVIDER_SITE_OTHER): Payer: PRIVATE HEALTH INSURANCE

## 2013-10-11 ENCOUNTER — Ambulatory Visit (INDEPENDENT_AMBULATORY_CARE_PROVIDER_SITE_OTHER): Payer: PRIVATE HEALTH INSURANCE | Admitting: Sports Medicine

## 2013-10-11 ENCOUNTER — Encounter: Payer: Self-pay | Admitting: Sports Medicine

## 2013-10-11 VITALS — BP 113/65 | HR 56 | Ht 69.5 in | Wt 156.0 lb

## 2013-10-11 DIAGNOSIS — M48061 Spinal stenosis, lumbar region without neurogenic claudication: Secondary | ICD-10-CM

## 2013-10-11 MED ORDER — MELOXICAM 15 MG PO TABS: ORAL_TABLET | ORAL | Status: DC

## 2013-10-11 MED ORDER — PREDNISONE 50 MG PO TABS: ORAL_TABLET | ORAL | Status: DC

## 2013-10-11 NOTE — Progress Notes (Signed)
   Subjective:    I'm seeing this patient as a consultation for:  Dr. Beatrice Lecher MD   CC: Back/hip/leg pain  HPI: Patient is an active and healthy 38 year old woman with low back pain since march that shot down her leg two days ago. She says that the pain in her back and hip was never terrible, and usually rest and advil was sufficient to deal with it, but when the pain went down her hip and into her leg, she says that it was a 9. The pain was accompanied by numbness and tingling on the lateral edge of her foot. She says that the pain is much worse when running downhill, and very slightly worse with deep forward flexion and rotation through the spine. Denies any changes with coughing sneezing or valsalva. Patient denies fever, sweats, weight gain/loss. Pain is moderate and persistent.   Past medical history, Surgical history, Family history not pertinant except as noted below, Social history, Allergies, and medications have been entered into the medical record, reviewed, and no changes needed.   Review of Systems: No headache, visual changes, nausea, vomiting, diarrhea, constipation, dizziness, abdominal pain, skin rash, swollen lymph nodes, muscle aches, chest pain, shortness of breath, mood changes, visual or auditory hallucinations.   Objective:   General: Well Developed, well nourished, and in no acute distress.  Neuro/Psych: Alert and oriented x3, extra-ocular muscles intact, able to move all 4 extremities, sensation grossly intact. Skin: Warm and dry, no rashes noted.  Respiratory: Not using accessory muscles, speaking in full sentences, trachea midline.  Cardiovascular: Pulses palpable, no extremity edema. Abdomen: Does not appear distended.  Back Exam:  Inspection: Unremarkable  Motion: Flexion 45 deg, Extension 45 deg, Side Bending to 45 deg bilaterally,  Rotation to 45 deg bilaterally   Palpable tenderness: Very slight in lumbosacral region  Right Hip: ROM IR: 45 Deg,  ER: 45 Deg, Flexion: 120 Deg, Extension: 100 Deg, Abduction: 45 Deg, Adduction: 45 Deg Strength IR: 5/5, ER: 5/5, Flexion: 5/5, Extension: 5/5, Abduction: 5/5, Adduction: 5/5 Some pain and reproduction of symptoms with external rotation through right hip. Pelvic alignment unremarkable to inspection and palpation. Standing hip rotation and gait without trendelenburg sign / unsteadiness. Greater trochanter without tenderness to palpation. No tenderness over piriformis.   Impression and Recommendations:    The lancing pain down the patient's leg follows an L5 distribution; the patch of numbness and tingling in her leg is more consistent with S1, but some overlap is possible. Further, the changes in her pain with running downhill are suggestive of radicular origin. An old CT shows marked disc protrusion at L4-L5. We prescribed meloxicam and Physical therapy.   This case required medical decision making of moderate complexity.

## 2013-10-11 NOTE — Assessment & Plan Note (Signed)
With mostly lower right extremity radicular symptoms. Interestingly, she feels better running uphill, this preference for flexion is common with lumbar spinal stenosis. Based on a CT scan from 2014 she does have L4-L5 central canal stenosis, multifactorial from ligamentum flavum hypertrophy and disc protrusion. Prednisone, Mobic, formal physical therapy, x-rays. Return in one month, injection if no better.

## 2013-10-15 ENCOUNTER — Ambulatory Visit (INDEPENDENT_AMBULATORY_CARE_PROVIDER_SITE_OTHER): Payer: PRIVATE HEALTH INSURANCE | Admitting: Ophthalmology

## 2013-10-15 DIAGNOSIS — H43819 Vitreous degeneration, unspecified eye: Secondary | ICD-10-CM

## 2013-10-15 DIAGNOSIS — H35419 Lattice degeneration of retina, unspecified eye: Secondary | ICD-10-CM

## 2013-10-15 DIAGNOSIS — H33309 Unspecified retinal break, unspecified eye: Secondary | ICD-10-CM

## 2013-10-23 ENCOUNTER — Ambulatory Visit (INDEPENDENT_AMBULATORY_CARE_PROVIDER_SITE_OTHER): Payer: PRIVATE HEALTH INSURANCE | Admitting: Physical Therapy

## 2013-10-23 DIAGNOSIS — M48061 Spinal stenosis, lumbar region without neurogenic claudication: Secondary | ICD-10-CM

## 2013-10-23 DIAGNOSIS — M6281 Muscle weakness (generalized): Secondary | ICD-10-CM

## 2013-10-23 DIAGNOSIS — M545 Low back pain, unspecified: Secondary | ICD-10-CM

## 2013-10-26 ENCOUNTER — Encounter (INDEPENDENT_AMBULATORY_CARE_PROVIDER_SITE_OTHER): Payer: PRIVATE HEALTH INSURANCE | Admitting: Physical Therapy

## 2013-10-26 DIAGNOSIS — M545 Low back pain, unspecified: Secondary | ICD-10-CM

## 2013-10-26 DIAGNOSIS — M48061 Spinal stenosis, lumbar region without neurogenic claudication: Secondary | ICD-10-CM

## 2013-10-26 DIAGNOSIS — M6281 Muscle weakness (generalized): Secondary | ICD-10-CM

## 2013-10-30 ENCOUNTER — Encounter (INDEPENDENT_AMBULATORY_CARE_PROVIDER_SITE_OTHER): Payer: PRIVATE HEALTH INSURANCE | Admitting: Physical Therapy

## 2013-10-30 DIAGNOSIS — M545 Low back pain, unspecified: Secondary | ICD-10-CM

## 2013-10-30 DIAGNOSIS — M6281 Muscle weakness (generalized): Secondary | ICD-10-CM

## 2013-10-30 DIAGNOSIS — M48061 Spinal stenosis, lumbar region without neurogenic claudication: Secondary | ICD-10-CM

## 2013-10-31 ENCOUNTER — Encounter: Payer: PRIVATE HEALTH INSURANCE | Admitting: Physical Therapy

## 2013-11-14 ENCOUNTER — Ambulatory Visit (INDEPENDENT_AMBULATORY_CARE_PROVIDER_SITE_OTHER): Payer: PRIVATE HEALTH INSURANCE | Admitting: Sports Medicine

## 2013-11-14 ENCOUNTER — Encounter: Payer: Self-pay | Admitting: Sports Medicine

## 2013-11-14 VITALS — BP 117/80 | HR 67 | Ht 69.5 in | Wt 159.0 lb

## 2013-11-14 DIAGNOSIS — B009 Herpesviral infection, unspecified: Secondary | ICD-10-CM

## 2013-11-14 DIAGNOSIS — M48061 Spinal stenosis, lumbar region without neurogenic claudication: Secondary | ICD-10-CM

## 2013-11-14 MED ORDER — VALACYCLOVIR HCL 1 G PO TABS: 1000.0000 mg | ORAL_TABLET | Freq: Every day | ORAL | Status: DC

## 2013-11-14 NOTE — Assessment & Plan Note (Signed)
Noted on CT scan at the L4-L5 level. Overall resolved. Return as needed, continue to do exercises on a daily or every other day basis.

## 2013-11-14 NOTE — Assessment & Plan Note (Signed)
Refill of Valtrex.

## 2013-11-14 NOTE — Progress Notes (Signed)
  Subjective:    CC: Followup  HPI: Lumbar spinal stenosis : with bilateral L4 radicular symptoms, overall greatly improved, essentially resolved with prednisone and formal physical therapy.  Cold sore: Bottom lip, right sides, needs Valtrex.  Past medical history, Surgical history, Family history not pertinant except as noted below, Social history, Allergies, and medications have been entered into the medical record, reviewed, and no changes needed.   Review of Systems: No fevers, chills, night sweats, weight loss, chest pain, or shortness of breath.   Objective:    General: Well Developed, well nourished, and in no acute distress.  Neuro: Alert and oriented x3, extra-ocular muscles intact, sensation grossly intact.  HEENT: Normocephalic, atraumatic, pupils equal round reactive to light, neck supple, no masses, no lymphadenopathy, thyroid nonpalpable. There is a cold sore on her bottom lip. Skin: Warm and dry, no rashes. Cardiac: Regular rate and rhythm, no murmurs rubs or gallops, no lower extremity edema.  Respiratory: Clear to auscultation bilaterally. Not using accessory muscles, speaking in full sentences.  Impression and Recommendations:

## 2013-11-21 ENCOUNTER — Encounter: Payer: Self-pay | Admitting: Family Medicine

## 2013-11-21 ENCOUNTER — Ambulatory Visit (INDEPENDENT_AMBULATORY_CARE_PROVIDER_SITE_OTHER): Payer: PRIVATE HEALTH INSURANCE | Admitting: Family Medicine

## 2013-11-21 VITALS — BP 117/86 | HR 58 | Ht 70.0 in | Wt 159.0 lb

## 2013-11-21 DIAGNOSIS — D352 Benign neoplasm of pituitary gland: Secondary | ICD-10-CM | POA: Diagnosis not present

## 2013-11-21 DIAGNOSIS — J321 Chronic frontal sinusitis: Secondary | ICD-10-CM | POA: Diagnosis not present

## 2013-11-21 DIAGNOSIS — D353 Benign neoplasm of craniopharyngeal duct: Secondary | ICD-10-CM

## 2013-11-21 MED ORDER — AMOXICILLIN-POT CLAVULANATE 875-125 MG PO TABS: 1.0000 | ORAL_TABLET | Freq: Two times a day (BID) | ORAL | Status: DC

## 2013-11-21 NOTE — Patient Instructions (Signed)
We will use Augmentin for chronic sinusitis.  If not some improvement by then end then we can try a different antibiotic that is a fluoroquinolone.

## 2013-11-21 NOTE — Progress Notes (Signed)
Subjective:    Patient ID: Emily Herring, female    DOB: Mar 06, 1975, 38 y.o.   MRN: [id number]  Sinusitis Pertinent negatives include no coughing, diaphoresis or headaches.   Here to restab care.   Had an MRI in May at [facility] this year and was told have a deviated septum.  Says constantly has a pressure over her right eye.  Says occ has drip from the right nostril occ that has a very foul smell.  Seen at [facility] and given an ABX for 2 weeks. Had MRI of head done at [facility].    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 Her levels are up so had an MRI in spring to follow it and had a consult with neurology and told her to just follow. Didn't recommend treatment. Initially detected on bloodwork when was unable to get pregnant.   Review of Systems  Constitutional: Negative for fever, diaphoresis and unexpected weight change.  HENT: Negative for hearing loss, rhinorrhea and tinnitus.   Eyes: Negative for visual disturbance.  Respiratory: Negative for cough and wheezing.   Cardiovascular: Negative for chest pain and palpitations.  Gastrointestinal: Negative for nausea, vomiting, diarrhea and blood in stool.  Genitourinary: Negative for vaginal bleeding, vaginal discharge and difficulty urinating.  Musculoskeletal: Negative for arthralgias and myalgias.  Skin: Negative for rash.  Neurological: Negative for headaches.  Hematological: Negative for adenopathy. Does not bruise/bleed easily.  Psychiatric/Behavioral: Negative for sleep disturbance and dysphoric mood. The patient is not nervous/anxious.        BP 117/86  Pulse 58  Ht 5' 10"  (1.778 m)  Wt 159 lb (72.122 kg)  BMI 22.81 kg/m2    Allergies  Allergen Reactions  . Percocet [Oxycodone-Acetaminophen] Itching and Swelling    Past Medical History  Diagnosis Date  . H/O seasonal allergies     No past surgical history on file.  History   Social History  . Marital Status: Single    Spouse Name: N/A    Number of Children: N/A  . Years of Education: N/A   Occupational History  . Not on file.   Social History Main Topics  . Smoking status: Former Research scientist (life sciences)  . Smokeless tobacco: Current User     Comment: e-cig  . Alcohol Use: Yes     Comment: 2-3 per wk  . Drug Use: No  . Sexual Activity: Not on file   Other Topics Concern  . Not on file   Social History Narrative  . No narrative on file    Family History  Problem Relation Age of Onset  . Hypertension Father   . Alzheimer's disease Maternal Grandmother   . Dementia Father     Outpatient Encounter Prescriptions as of 11/21/2013  Medication Sig  . amoxicillin-clavulanate (AUGMENTIN) 875-125 MG per tablet Take 1 tablet by mouth 2 (two) times daily.  . fluticasone (FLONASE) 50 MCG/ACT nasal spray Place 2 sprays into both nostrils daily.  Marland Kitchen loratadine (CLARITIN) 10 MG tablet Take 10 mg by mouth daily.  . meloxicam (MOBIC) 15 MG tablet One tab PO qAM with breakfast for 2 weeks, then daily prn pain.  . valACYclovir (VALTREX) 1000 MG tablet Take 1 tablet (1,000 mg total) by mouth daily.  . [DISCONTINUED] amoxicillin-clavulanate (AUGMENTIN) 875-125 MG per tablet Take 1 tablet by mouth 2 (two) times daily.       Objective:   Physical Exam  Constitutional: She is oriented to person, place, and time. She appears well-developed and well-nourished.  HENT:  Head: Normocephalic and atraumatic.  Right Ear: External ear normal.  Left Ear: External ear normal.  Nose: Nose normal.  Mouth/Throat: Oropharynx is clear and moist.  TMs and canals are clear.   Eyes: Conjunctivae and EOM are normal. Pupils are equal, round, and reactive to light.  Neck: Neck supple. No thyromegaly present.  Cardiovascular: Normal rate, regular rhythm and normal heart sounds.   Pulmonary/Chest: Effort normal and breath sounds normal. She has no wheezes.  Lymphadenopathy:    She has no cervical adenopathy.  Neurological: She is alert and oriented to person,  place, and time.  Skin: Skin is warm and dry.  Psychiatric: She has a normal mood and affect.          Assessment & Plan:  Gets flu shot at work.    Chronic sinusitis - will tx with 4 weeks of Augment. If not better will tx with a FQ.  Will call and find out if any abnormalities on MRI done in May.    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

## 2013-11-26 ENCOUNTER — Encounter: Payer: Self-pay | Admitting: Family Medicine

## 2014-01-02 DIAGNOSIS — N970 Female infertility associated with anovulation: Secondary | ICD-10-CM | POA: Insufficient documentation

## 2014-01-21 ENCOUNTER — Ambulatory Visit (INDEPENDENT_AMBULATORY_CARE_PROVIDER_SITE_OTHER): Payer: Self-pay | Admitting: Family Medicine

## 2014-01-21 ENCOUNTER — Encounter: Payer: Self-pay | Admitting: Family Medicine

## 2014-01-21 VITALS — BP 116/74 | HR 49 | Temp 97.8°F | Ht 70.0 in | Wt 162.0 lb

## 2014-01-21 DIAGNOSIS — J320 Chronic maxillary sinusitis: Secondary | ICD-10-CM

## 2014-01-21 DIAGNOSIS — Z23 Encounter for immunization: Secondary | ICD-10-CM

## 2014-01-21 NOTE — Progress Notes (Signed)
   Subjective:    Patient ID: Emily Herring, female    DOB: 12-25-1975, 38 y.o.   MRN: [id number]  HPI Says has had sinsus pressure on  The right side of her face and around the eye. Started in march of 2015.  Had MRI in April/May? .  A fever chills or sweats. I saw her at the end of September and put her on a four-week course of Augmentin. She says about 2 and half weeks into the antibiotic treatment she has started to feel significantly better for the 1st time since March. She felt great until about a week after she completed the antibiotic and then started feeling the pain and pressure again around the right eye. No significant chin congestion. She's continue to use her nasal steroid spray.   Review of Systems     Objective:   Physical Exam  Constitutional: She is oriented to person, place, and time. She appears well-developed and well-nourished.  HENT:  Head: Normocephalic and atraumatic.  Right Ear: External ear normal.  Left Ear: External ear normal.  Nose: Nose normal.  Mouth/Throat: Oropharynx is clear and moist.  TMs and canals are clear. Right facial cheek appears to be more swollen compared the left but no erythema. She's also mildly tender around this area.  Eyes: Conjunctivae and EOM are normal. Pupils are equal, round, and reactive to light.  Neck: Neck supple. No thyromegaly present.  Cardiovascular: Normal rate, regular rhythm and normal heart sounds.   Pulmonary/Chest: Effort normal and breath sounds normal. She has no wheezes.  Lymphadenopathy:    She has no cervical adenopathy.  Neurological: She is alert and oriented to person, place, and time.  Skin: Skin is warm and dry.  Psychiatric: She has a normal mood and affect.          Assessment & Plan:  right facial pain/possible chronic right sinusitis-it concerns me that she started to feel bad again within one week of completing her four-week course of antibiotics. I'm going to go ahead and referred ENT for  further evaluation. She does have a deviated septum and certainly could have a nasal polyp etc. that could be causing some obstruction and making it difficult for her to clear a infection. She feels like she's getting worse than please call the office back.

## 2014-04-02 ENCOUNTER — Encounter: Payer: Self-pay | Admitting: *Deleted

## 2014-04-02 ENCOUNTER — Emergency Department
Admission: EM | Admit: 2014-04-02 | Discharge: 2014-04-02 | Disposition: A | Discharge status: Home / Self Care | Payer: PRIVATE HEALTH INSURANCE | Source: Home / Self Care | Attending: Emergency Medicine | Admitting: Emergency Medicine

## 2014-04-02 ENCOUNTER — Emergency Department (INDEPENDENT_AMBULATORY_CARE_PROVIDER_SITE_OTHER): Payer: PRIVATE HEALTH INSURANCE

## 2014-04-02 DIAGNOSIS — R0781 Pleurodynia: Secondary | ICD-10-CM

## 2014-04-02 DIAGNOSIS — M546 Pain in thoracic spine: Secondary | ICD-10-CM

## 2014-04-02 DIAGNOSIS — R112 Nausea with vomiting, unspecified: Secondary | ICD-10-CM

## 2014-04-02 DIAGNOSIS — R1012 Left upper quadrant pain: Secondary | ICD-10-CM

## 2014-04-02 DIAGNOSIS — M4184 Other forms of scoliosis, thoracic region: Secondary | ICD-10-CM

## 2014-04-02 DIAGNOSIS — R5383 Other fatigue: Secondary | ICD-10-CM

## 2014-04-02 LAB — COMPLETE METABOLIC PANEL WITH GFR
ALT: 9 U/L (ref 0–35)
AST: 12 U/L (ref 0–37)
Albumin: 4.2 g/dL (ref 3.5–5.2)
Alkaline Phosphatase: 38 U/L — ABNORMAL LOW (ref 39–117)
BUN: 13 mg/dL (ref 6–23)
CO2: 27 mEq/L (ref 19–32)
Calcium: 8.9 mg/dL (ref 8.4–10.5)
Chloride: 105 mEq/L (ref 96–112)
Creat: 1.02 mg/dL (ref 0.50–1.10)
GFR, Est African American: 80 mL/min
GFR, Est Non African American: 69 mL/min
Glucose, Bld: 91 mg/dL (ref 70–99)
Potassium: 3.8 mEq/L (ref 3.5–5.3)
Sodium: 139 mEq/L (ref 135–145)
Total Bilirubin: 0.6 mg/dL (ref 0.2–1.2)
Total Protein: 7.1 g/dL (ref 6.0–8.3)

## 2014-04-02 LAB — POCT URINALYSIS DIP (MANUAL ENTRY)
Bilirubin, UA: NEGATIVE
Blood, UA: NEGATIVE
Glucose, UA: NEGATIVE
Ketones, POC UA: NEGATIVE
Leukocytes, UA: NEGATIVE
Nitrite, UA: NEGATIVE
Protein Ur, POC: NEGATIVE
Spec Grav, UA: 1.025
Urobilinogen, UA: 0.2
pH, UA: 6

## 2014-04-02 LAB — POCT CBC W AUTO DIFF (K'VILLE URGENT CARE)

## 2014-04-02 LAB — POCT MONO SCREEN (KUC): Mono, POC: NEGATIVE

## 2014-04-02 LAB — AMYLASE: Amylase: 58 U/L (ref 0–105)

## 2014-04-02 LAB — LIPASE: Lipase: 53 U/L (ref 0–75)

## 2014-04-02 MED ORDER — ONDANSETRON HCL 4 MG PO TABS: 4.0000 mg | ORAL_TABLET | Freq: Four times a day (QID) | ORAL | Status: DC

## 2014-04-02 NOTE — ED Notes (Signed)
Sherilynn c/o upper mid abdominal cramping starting Saturday. Seems to worsen after eating, c/o loose stools. Pain returned last night as severe with one episode of vomiting and diarrhea. No current pain, c/o nausea.

## 2014-04-02 NOTE — ED Provider Notes (Signed)
CSN: [id number]     Arrival date & time 04/02/14  0911 History   First MD Initiated Contact with Patient 04/02/14 [id number]     Chief Complaint  Patient presents with  . Abdominal Cramping  and Nausea and back pain   Patient is a 39 y.o. female presenting with abdominal pain and back pain. The history is provided by the patient (her female partner ).  Abdominal Pain Pain location:  LUQ Pain quality: cramping   Pain radiates to:  Back Pain severity:  Unable to specify (currently 2/10, but was as high as 5/10 yesterday) Onset quality:  Unable to specify Duration:  3 days Timing:  Intermittent Progression:  Waxing and waning Chronicity:  New Context: not previous surgeries, not recent travel, not sick contacts, not suspicious food intake and not trauma   Relieved by:  Nothing Worsened by:  Eating Ineffective treatments: generic Zantac yesterday. Associated symptoms: diarrhea (times one yesterday), fatigue (for several weeks), nausea and vomiting (times one yesterday)   Associated symptoms: no belching, no chest pain, no chills, no cough, no dysuria, no fever, no hematemesis, no hematochezia, no hematuria, no melena, no shortness of breath, no sore throat, no vaginal bleeding and no vaginal discharge   Back Pain Location:  Thoracic spine (midline lower thoracic spine) Quality: Sharp. Radiates to:  Does not radiate Pain severity:  Unable to specify Onset quality:  Sudden (started after she fell on her back 02/21/2014) Progression:  Unable to specify Chronicity:  New Relieved by: rest. Worsened by:  Bending Ineffective treatments: ibuprofen. Associated symptoms: abdominal pain   Associated symptoms: no bladder incontinence, no bowel incontinence, no chest pain, no dysuria, no fever, no leg pain and no numbness     Past Medical History  Diagnosis Date  . H/O seasonal allergies    Past Surgical History  Procedure Laterality Date  . Lasik Bilateral 05/13/11   Family History  Problem  Relation Age of Onset  . Hypertension Father   . Alzheimer's disease Maternal Grandmother   . Dementia Father    History  Substance Use Topics  . Smoking status: Former Research scientist (life sciences)  . Smokeless tobacco: Former Systems developer     Comment: Psychologist, sport and exercise  . Alcohol Use: Yes     Comment: 1-2 per wk   OB History    No data available     Review of Systems  Constitutional: Positive for fatigue (for several weeks). Negative for fever and chills.  HENT: Negative for sore throat.   Respiratory: Negative for cough and shortness of breath.   Cardiovascular: Negative for chest pain.  Gastrointestinal: Positive for nausea, vomiting (times one yesterday), abdominal pain and diarrhea (times one yesterday). Negative for melena, hematochezia, hematemesis and bowel incontinence.  Genitourinary: Negative for bladder incontinence, dysuria, hematuria, vaginal bleeding and vaginal discharge.  Musculoskeletal: Positive for back pain.  Neurological: Negative for numbness.  All other systems reviewed and are negative.  LMP One week ago. She denies chance of pregnancy. Denies any vaginal heterosexual intercourse. Allergies  Percocet  Home Medications   Prior to Admission medications   Medication Sig Start Date End Date Taking? Authorizing Provider  fluticasone (FLONASE) 50 MCG/ACT nasal spray Place 2 sprays into both nostrils daily. 09/25/13   Janeann Forehand, MD  loratadine (CLARITIN) 10 MG tablet Take 10 mg by mouth daily.    Historical Provider, MD  medroxyPROGESTERone (PROVERA) 10 MG tablet Take by mouth.    Historical Provider, MD  meloxicam (MOBIC) 15 MG tablet One tab PO qAM  with breakfast for 2 weeks, then daily prn pain. 10/11/13   Silverio Decamp, MD  Omega-3 Fatty Acids (FISH OIL) 500 MG CAPS Take 1,000 mg by mouth daily.    Historical Provider, MD  ondansetron (ZOFRAN) 4 MG tablet Take 1 tablet (4 mg total) by mouth every 6 (six) hours. As needed for nausea or vomiting 04/02/14   Jacqulyn Cane, MD    valACYclovir (VALTREX) 1000 MG tablet Take 1 tablet (1,000 mg total) by mouth daily. 11/14/13   Silverio Decamp, MD   BP 125/80 mmHg  Pulse 85  Temp(Src) 97.9 F (36.6 C) (Oral)  Resp 14  Wt 157 lb (71.215 kg)  SpO2 97%  LMP 03/26/2014 Physical Exam  Constitutional: She is oriented to person, place, and time. She appears well-developed and well-nourished. No distress.  Appears uncomfortable, but no acute distress. She states her pain is currently 2-3 out of 10  HENT:  Head: Normocephalic and atraumatic.  Mouth/Throat: Oropharynx is clear and moist.  Eyes: Conjunctivae and EOM are normal. Pupils are equal, round, and reactive to light. No scleral icterus.  Neck: Normal range of motion. Neck supple. No JVD present. No tracheal deviation present.  Cardiovascular: Normal rate, regular rhythm and normal heart sounds.   Pulmonary/Chest: Effort normal and breath sounds normal. No respiratory distress. She exhibits tenderness (Very tender left lateral ribs. No deformity or ecchymosis).  Abdominal: Soft. Bowel sounds are normal. She exhibits no distension and no mass. There is no splenomegaly or hepatomegaly. There is tenderness in the epigastric area and left upper quadrant. There is guarding (mild). There is no rebound and no CVA tenderness. Hernia confirmed negative in the ventral area.    Musculoskeletal: Normal range of motion.       Thoracic back: She exhibits tenderness and bony tenderness. She exhibits no swelling, no deformity and no laceration.       Lumbar back: She exhibits no tenderness and no laceration.       Back:  Lymphadenopathy:    She has no cervical adenopathy.  Neurological: She is alert and oriented to person, place, and time. She has normal strength. No cranial nerve deficit or sensory deficit.  Skin: Skin is warm. No rash noted.  Psychiatric: She has a normal mood and affect.  Nursing note and vitals reviewed.   ED Course  Procedures (including critical  care time) Labs Review Labs Reviewed  COMPLETE METABOLIC PANEL WITH GFR  AMYLASE  LIPASE  POCT CBC W AUTO DIFF ([facility])  POCT MONO SCREEN ([facility])  POCT URINALYSIS DIP (MANUAL ENTRY)   Results for orders placed or performed during the hospital encounter of 04/02/14  POCT CBC w auto diff ([facility])  Result Value Ref Range   WBC  4.5 - 10.5 K/uL   Lymphocytes relative %  15 - 45 %   Monocytes relative %  2 - 10 %   Neutrophils relative % (GR)  44 - 76 %   Lymphocytes absolute  0.1 - 1.8 K/uL   Monocyes absolute  0.1 - 1 K/uL   Neutrophils absolute (GR#)  1.7 - 7.8 K/uL   RBC  3.8 - 5.1 MIL/uL   Hemoglobin  11.8 - 15.5 g/dL   Hematocrit  34.8 - 46 %   MCV  78 - 100 fL   MCH  26 - 32 pg   MCHC  32 - 36.5 g/dL   RDW  11.6 - 14 %   Platelet count  140 -  400 K/uL   MPV  7.8 - 11 fL  POCT mono screen  Result Value Ref Range   Mono, POC Negative Negative  POCT urinalysis dipstick  Result Value Ref Range   Color, UA yellow    Clarity, UA clear    Glucose, UA neg    Bilirubin, UA negative    Bilirubin, UA negative    Spec Grav, UA 1.025    Blood, UA negative    pH, UA 6.0    Protein Ur, POC negative    Urobilinogen, UA 0.2    Nitrite, UA Negative    Leukocytes, UA Negative    Urinalysis within normal limits.--clinically, no evidence of kidney stone or urinary cause for symptoms. Monospot test negative CBC within normal limits. Hemoglobin 13.5, WBC 6.7 Amylase, lipase, CMP pending  Imaging Review Dg Ribs Unilateral W/chest Left  04/02/2014   CLINICAL DATA:  Nausea, vomiting, fall in Christmas Eve, back pain  EXAM: LEFT RIBS AND CHEST - 3+ VIEW  COMPARISON:  None.  FINDINGS: Three views left ribs submitted. No acute infiltrate or pulmonary edema. No left rib fracture. No pneumothorax. There is dextroscoliosis mid thoracic spine.  IMPRESSION: No left rib fracture.  Dextroscoliosis mid thoracic spine.   Electronically Signed   By: Lahoma Crocker M.D.   On:  04/02/2014 10:48   Dg Thoracic Spine 2 View  04/02/2014   CLINICAL DATA:  Abdominal pain, fall in Christmas Eve, left side pain  EXAM: THORACIC SPINE - 2 VIEW  COMPARISON:  None.  FINDINGS: Three views of thoracic spine submitted. No acute fracture or subluxation. There is dextroscoliosis mid thoracic spine.  IMPRESSION: No acute fracture or subluxation. Dextroscoliosis mid thoracic spine.   Electronically Signed   By: Lahoma Crocker M.D.   On: 04/02/2014 10:49     MDM   1. Abdominal pain, left upper quadrant   2. Pain in thoracic spine   3. Non-intractable vomiting with nausea, vomiting of unspecified type   4. Other fatigue   5. Rib pain on left side    No evidence of any acute abdomen. No evidence of any acute cardiorespiratory event. Possible cause is acute viral gastroenteritis, which is improving on its own, as her pain level is currently 2/10. Over 45 minutes spent, greater than 50% of the time spent for counseling and coordination of care.--discussed at length with patient and partner. Questions invited answered as best I could. Treatment options discussed, as well as risks, benefits, alternatives. They voiced understanding and agreement with the following plans:  Discharge Medication List as of 04/02/2014 11:14 AM    START taking these medications   Details  ondansetron (ZOFRAN) 4 MG tablet Take 1 tablet (4 mg total) by mouth every 6 (six) hours. As needed for nausea or vomiting, Starting 04/02/2014, Until Discontinued, Normal       Prilosec OTC twice a day 7 days. Avoid NSAIDs for now, try Tylenol OTC. Bland diet. Follow-up with Dr. Madilyn Fireman, your primary care doctor in 5-7 days, or sooner if symptoms become worse. Precautions discussed. Red flags discussed.--ER if any red flags. Questions invited and answered. Patient and her partner voiced understanding and agreement.    Jacqulyn Cane, MD 04/02/14 [phone]

## 2014-04-03 ENCOUNTER — Telehealth: Payer: Self-pay | Admitting: *Deleted

## 2014-04-03 NOTE — Telephone Encounter (Signed)
-----   Message from Jacqulyn Cane, MD sent at 04/02/2014 10:15 PM EST ----- Please inform pt: CMP (liver and kidney tests), Amylase/Lipase (pancreas tests) all WNL.

## 2014-04-08 ENCOUNTER — Ambulatory Visit: Payer: PRIVATE HEALTH INSURANCE | Admitting: Family Medicine

## 2014-04-08 DIAGNOSIS — Z0289 Encounter for other administrative examinations: Secondary | ICD-10-CM

## 2014-05-15 ENCOUNTER — Ambulatory Visit (INDEPENDENT_AMBULATORY_CARE_PROVIDER_SITE_OTHER): Payer: PRIVATE HEALTH INSURANCE | Admitting: Sports Medicine

## 2014-05-15 ENCOUNTER — Encounter: Payer: Self-pay | Admitting: Sports Medicine

## 2014-05-15 VITALS — BP 113/72 | Ht 70.0 in | Wt 153.0 lb

## 2014-05-15 DIAGNOSIS — M5489 Other dorsalgia: Secondary | ICD-10-CM | POA: Insufficient documentation

## 2014-05-15 DIAGNOSIS — M545 Low back pain, unspecified: Secondary | ICD-10-CM

## 2014-05-15 DIAGNOSIS — M4120 Other idiopathic scoliosis, site unspecified: Secondary | ICD-10-CM

## 2014-05-15 DIAGNOSIS — R269 Unspecified abnormalities of gait and mobility: Secondary | ICD-10-CM | POA: Insufficient documentation

## 2014-05-15 NOTE — Assessment & Plan Note (Addendum)
Causing R sided lumbar nerve compression which is causing her buttocks and leg pain when she runs downhill  - exercises:  with squat bar, side bend to L side With squat bar, side to side twists With squat bar, forward lunge and rotate opposite elbow to knee  On gait analysis she lands in supination on the lateral heel and toes.  Both feet terminal intoeing w L>R We provided her with sports inserts with lateral 5th ray and heel support LT/ 5th Ray on RT

## 2014-05-15 NOTE — Progress Notes (Signed)
  Emily Herring - 39 y.o. female MRN [id number]  Date of birth: [birth year]  SUBJECTIVE:  Including CC & ROS.  39 yo runner here with midline upper lumbar back pain as well as R sided buttocks and leg pain when running downhill.  She was carrying her dog in December when she slipped and fell backwards, hitting her back against a step.  She had significant bruising and since then has had midline sharp back pain with prolonged standing.  Reports normal XRs done in early Feb. She has also had a several month history of R sided buttocks, leg pain, and occasionally foot numbness when she runs downhill.  No symptoms when running uphill or on flat surfaces.     HISTORY: Past Medical, Surgical, Social, and Family History Reviewed & Updated per EMR.   Pertinent Historical Findings include: Runs several days a week  DATA REVIEWED: XR:  Significant scoliosis with compression of R side in lumbar region  PHYSICAL EXAM:  VS: BP:113/72 mmHg  HR: bpm  TEMP: ( )  RESP:   HT:5' 10"  (177.8 cm)   WT:153 lb (69.4 kg)  BMI:22 PHYSICAL EXAM: Back: TTP midline over L1-L2.  No paraspinal tenderness but there is R sided paraspinal muscle atrophy.  Pain worse with extension, especially while standing on one foot.  No worsening of pain with twisting or forward flexion.  Negative straight leg raise.  No TTP over SI joints, but tight R SI joint with hip rocking.  Negative Faber's on both sides.  Pain with side bend to ride side.  No pain with side bend to L side. R Hip: normal rotation, normal strength with flexion, abduction, and abduction.  Pain with internal rotation engaging lumbar spine. R knee: Normal to inspection with no erythema or effusion or obvious bony abnormalities. Palpation normal with no warmth, joint line tenderness, patellar tenderness, or condyle tenderness. ROM full in flexion and extension and lower leg rotation. Ligaments with solid consistent endpoints including ACL, PCL, LCL, MCL. Negative  Mcmurray's. Non painful patellar compression. Patellar glide without crepitus. Patellar and quadriceps tendons unremarkable. Hamstring and quadriceps strength is normal.  Foot exam:  Both 5th toes rotate inwards and rest under 4th toes.   Gait: Excessive engagement of lateral heel and toes with supinated landing, eversion L > R LT shows IR of forefoot x midline/ RT shows mild x of midline  ASSESSMENT & PLAN: See problem based charting & AVS for pt instructions. Problem List Items Addressed This Visit    Idiopathic scoliosis - Primary    Causing R sided lumbar nerve compression which is causing her buttocks and leg pain when she runs downhill  - exercises:  with squat bar, side bend to L side With squat bar, side to side twists With squat bar, forward lunge and rotate opposite elbow to knee  On gait analysis she lands on the lateral heel and toes.  Both feet evert with L > R We provided her with sports inserts with lateral toe and heel support      Midline back pain    Likely 2/2 small posterior spinal process fracture - should improve with time        Janan Halter, PGY2 [facility]

## 2014-05-15 NOTE — Assessment & Plan Note (Signed)
Sports insoles with correction of supination clearly improve her gait

## 2014-05-15 NOTE — Assessment & Plan Note (Signed)
Likely 2/2 small posterior spinal process fracture - should improve with time

## 2014-07-26 ENCOUNTER — Ambulatory Visit (INDEPENDENT_AMBULATORY_CARE_PROVIDER_SITE_OTHER): Payer: PRIVATE HEALTH INSURANCE | Admitting: Family Medicine

## 2014-07-26 ENCOUNTER — Encounter: Payer: Self-pay | Admitting: Family Medicine

## 2014-07-26 VITALS — BP 115/75 | HR 58 | Wt 157.0 lb

## 2014-07-26 DIAGNOSIS — J0110 Acute frontal sinusitis, unspecified: Secondary | ICD-10-CM | POA: Diagnosis not present

## 2014-07-26 MED ORDER — FLUTICASONE PROPIONATE 50 MCG/ACT NA SUSP: 2.0000 | Freq: Every day | NASAL | Status: DC

## 2014-07-26 MED ORDER — LEVOFLOXACIN 500 MG PO TABS: 500.0000 mg | ORAL_TABLET | Freq: Every day | ORAL | Status: AC

## 2014-07-26 NOTE — Progress Notes (Signed)
   Subjective:    Patient ID: Emily Herring, female    DOB: [birth year], 39 y.o.   MRN: [id number]  HPI She is leaving the country tomorrow and wanted to get check out.  She has had a pressure HA. above the right eye.  Hx of deviated septum. Pain x 2 weeks.  No fever, chillls, or sweats. Taking Advil - helped some.   + post nasal drip.  Noticing bad odor in the sinuses.  No ST.     Review of Systems     Objective:   Physical Exam  Constitutional: She is oriented to person, place, and time. She appears well-developed and well-nourished.  HENT:  Head: Normocephalic and atraumatic.  Right Ear: External ear normal.  Left Ear: External ear normal.  Nose: Nose normal.  Mouth/Throat: Oropharynx is clear and moist.  TMs and canals are clear.   Eyes: Conjunctivae and EOM are normal. Pupils are equal, round, and reactive to light.  Neck: Neck supple. No thyromegaly present.  Cardiovascular: Normal rate, regular rhythm and normal heart sounds.   Pulmonary/Chest: Effort normal and breath sounds normal. She has no wheezes.  Lymphadenopathy:    She has no cervical adenopathy.  Neurological: She is alert and oriented to person, place, and time.  Skin: Skin is warm and dry.  Psychiatric: She has a normal mood and affect.          Assessment & Plan:  Acute until ethmoidal sinusitis-she has a history of recurrent sinus infections in this exact location. She then had a consult with ENT last year. They have discussed surgical correction but he felt like it may or may not actually correct the underlying problem. We'll go ahead and put her on a course of Levaquin 500 mg daily for 10 days. If that does not clear up her symptoms then please call back when she gets back into the country and let us know. Okay to continue symptom Medicare.

## 2014-10-25 ENCOUNTER — Ambulatory Visit (INDEPENDENT_AMBULATORY_CARE_PROVIDER_SITE_OTHER): Payer: BLUE CROSS/BLUE SHIELD | Admitting: Ophthalmology

## 2014-10-25 DIAGNOSIS — H35412 Lattice degeneration of retina, left eye: Secondary | ICD-10-CM | POA: Diagnosis not present

## 2014-10-25 DIAGNOSIS — H43813 Vitreous degeneration, bilateral: Secondary | ICD-10-CM | POA: Diagnosis not present

## 2014-10-25 DIAGNOSIS — H33303 Unspecified retinal break, bilateral: Secondary | ICD-10-CM

## 2015-06-24 DIAGNOSIS — Z01411 Encounter for gynecological examination (general) (routine) with abnormal findings: Secondary | ICD-10-CM | POA: Diagnosis not present

## 2015-06-24 DIAGNOSIS — D352 Benign neoplasm of pituitary gland: Secondary | ICD-10-CM | POA: Diagnosis not present

## 2015-06-24 DIAGNOSIS — B009 Herpesviral infection, unspecified: Secondary | ICD-10-CM | POA: Diagnosis not present

## 2015-06-24 DIAGNOSIS — N938 Other specified abnormal uterine and vaginal bleeding: Secondary | ICD-10-CM | POA: Diagnosis not present

## 2015-06-24 DIAGNOSIS — Z6822 Body mass index (BMI) 22.0-22.9, adult: Secondary | ICD-10-CM | POA: Diagnosis not present

## 2015-07-09 DIAGNOSIS — N939 Abnormal uterine and vaginal bleeding, unspecified: Secondary | ICD-10-CM | POA: Diagnosis not present

## 2015-08-18 DIAGNOSIS — Z1231 Encounter for screening mammogram for malignant neoplasm of breast: Secondary | ICD-10-CM | POA: Diagnosis not present

## 2015-08-26 DIAGNOSIS — F17210 Nicotine dependence, cigarettes, uncomplicated: Secondary | ICD-10-CM | POA: Diagnosis not present

## 2015-08-26 DIAGNOSIS — N925 Other specified irregular menstruation: Secondary | ICD-10-CM | POA: Diagnosis not present

## 2015-08-26 DIAGNOSIS — N938 Other specified abnormal uterine and vaginal bleeding: Secondary | ICD-10-CM | POA: Diagnosis not present

## 2015-08-26 DIAGNOSIS — N920 Excessive and frequent menstruation with regular cycle: Secondary | ICD-10-CM | POA: Insufficient documentation

## 2015-08-27 DIAGNOSIS — N6002 Solitary cyst of left breast: Secondary | ICD-10-CM | POA: Diagnosis not present

## 2015-08-27 DIAGNOSIS — N6489 Other specified disorders of breast: Secondary | ICD-10-CM | POA: Diagnosis not present

## 2015-08-27 DIAGNOSIS — R928 Other abnormal and inconclusive findings on diagnostic imaging of breast: Secondary | ICD-10-CM | POA: Diagnosis not present

## 2015-09-12 DIAGNOSIS — E221 Hyperprolactinemia: Secondary | ICD-10-CM | POA: Diagnosis not present

## 2015-10-13 DIAGNOSIS — D352 Benign neoplasm of pituitary gland: Secondary | ICD-10-CM | POA: Diagnosis not present

## 2015-12-01 ENCOUNTER — Emergency Department
Admission: EM | Admit: 2015-12-01 | Discharge: 2015-12-01 | Disposition: A | Discharge status: Home / Self Care | Payer: BLUE CROSS/BLUE SHIELD | Source: Home / Self Care | Attending: Family Medicine | Admitting: Family Medicine

## 2015-12-01 ENCOUNTER — Encounter: Payer: Self-pay | Admitting: *Deleted

## 2015-12-01 DIAGNOSIS — J019 Acute sinusitis, unspecified: Secondary | ICD-10-CM | POA: Diagnosis not present

## 2015-12-01 MED ORDER — AMOXICILLIN-POT CLAVULANATE 875-125 MG PO TABS: 1.0000 | ORAL_TABLET | Freq: Two times a day (BID) | ORAL | 0 refills | Status: DC

## 2015-12-01 MED ORDER — IBUPROFEN 600 MG PO TABS
600.0000 mg | ORAL_TABLET | Freq: Once | ORAL | Status: AC
  Administered 2015-12-01: 600 mg via ORAL

## 2015-12-01 NOTE — ED Triage Notes (Signed)
Pt c/o 10 days of nasal congestion, sore throat, now much better, and more recently colored blood tinged nasal drainage and right facial pain. Afebrile. Taken Sudafed and Zicam.

## 2015-12-01 NOTE — ED Provider Notes (Signed)
CSN: [id number]     Arrival date & time 12/01/15  4650 History   First MD Initiated Contact with Patient 12/01/15 1000     Chief Complaint  Patient presents with  . Nasal Congestion  . Sinus Problem   (Consider location/radiation/quality/duration/timing/severity/associated sxs/prior Treatment) HPI Emily Herring is a 40 y.o. female presenting to UC with c/o 10 days of worsening sinus congestion, pressure and now pain.  Symptoms initially started with a sore throat, which has since resolved.  She has been taking OTC Zicam and Sudafed but states more recently her mucous has become blood tinged and Right sided facial pain keeps worsening over the last 2 days.  Denies fever, chills, n/v/d.  Past Medical History:  Diagnosis Date  . H/O seasonal allergies    Past Surgical History:  Procedure Laterality Date  . LASIK Bilateral 05/13/11   Family History  Problem Relation Age of Onset  . Hypertension Father   . Dementia Father   . Alzheimer's disease Maternal Grandmother    Social History  Substance Use Topics  . Smoking status: Former Research scientist (life sciences)  . Smokeless tobacco: Former Systems developer     Comment: Psychologist, sport and exercise  . Alcohol use Yes     Comment: 1-2 per wk   OB History    No data available     Review of Systems  Constitutional: Negative for chills and fever.  HENT: Positive for congestion, ear pain ( pressure), rhinorrhea, sinus pressure and sore throat. Negative for trouble swallowing and voice change.   Respiratory: Negative for cough and shortness of breath.   Cardiovascular: Negative for chest pain and palpitations.  Gastrointestinal: Negative for abdominal pain, diarrhea, nausea and vomiting.  Musculoskeletal: Negative for arthralgias, back pain and myalgias.  Skin: Negative for rash.  Neurological: Positive for headaches. Negative for dizziness and light-headedness.    Allergies  Morphine and related and Percocet [oxycodone-acetaminophen]  Home Medications   Prior to Admission medications    Medication Sig Start Date End Date Taking? Authorizing Provider  amoxicillin-clavulanate (AUGMENTIN) 875-125 MG tablet Take 1 tablet by mouth 2 (two) times daily. One po bid x 7 days 12/01/15   Noland Fordyce, PA-C  fluticasone [facility]) 50 MCG/ACT nasal spray Place 2 sprays into both nostrils daily. 07/26/14   Hali Marry, MD  loratadine (CLARITIN) 10 MG tablet Take 10 mg by mouth daily.    Historical Provider, MD  meloxicam (MOBIC) 15 MG tablet One tab PO qAM with breakfast for 2 weeks, then daily prn pain. 10/11/13   Silverio Decamp, MD  Omega-3 Fatty Acids (FISH OIL) 500 MG CAPS Take 1,000 mg by mouth daily.    Historical Provider, MD  valACYclovir (VALTREX) 1000 MG tablet Take 1 tablet (1,000 mg total) by mouth daily. 11/14/13   Silverio Decamp, MD  valACYclovir (VALTREX) 500 MG tablet Take 500 mg by mouth 2 (two) times daily as needed. 05/03/14   Historical Provider, MD   Meds Ordered and Administered this Visit   Medications  ibuprofen (ADVIL,MOTRIN) tablet 600 mg (600 mg Oral Given 12/01/15 0957)    BP 124/75 (BP Location: Left Arm)   Pulse 60   Temp 97.7 F (36.5 C) (Oral)   Wt 156 lb (70.8 kg)   SpO2 98%   BMI 22.38 kg/m  No data found.   Physical Exam  Constitutional: She appears well-developed and well-nourished. No distress.  HENT:  Head: Normocephalic and atraumatic.  Right Ear: Tympanic membrane normal.  Left Ear: Tympanic membrane normal.  Nose: Right  sinus exhibits maxillary sinus tenderness and frontal sinus tenderness. Left sinus exhibits maxillary sinus tenderness. Left sinus exhibits no frontal sinus tenderness.  Mouth/Throat: Uvula is midline, oropharynx is clear and moist and mucous membranes are normal.  Eyes: Conjunctivae are normal. No scleral icterus.  Neck: Normal range of motion.  Cardiovascular: Normal rate, regular rhythm and normal heart sounds.   Pulmonary/Chest: Effort normal and breath sounds normal. No respiratory distress. She  has no wheezes. She has no rales.  Abdominal: Soft. She exhibits no distension. There is no tenderness.  Musculoskeletal: Normal range of motion.  Neurological: She is alert.  Skin: Skin is warm and dry. She is not diaphoretic.  Nursing note and vitals reviewed.   Urgent Care Course   Clinical Course    Procedures (including critical care time)  Labs Review Labs Reviewed - No data to display  Imaging Review No results found.    MDM   1. Acute rhinosinusitis    Pt c/o 10 days of worsening sinus symptoms. Sinus tenderness noted on exam. Will cover for bacterial cause.  Rx: Augmentin Home care instructions provided. F/u with PCP In 7-10 days if not improving. Patient verbalized understanding and agreement with treatment plan.     Noland Fordyce, PA-C 12/01/15 1011

## 2015-12-01 NOTE — Discharge Instructions (Signed)
°  You may take 400-676m Ibuprofen (Motrin) every 6-8 hours for fever and pain  Alternate with Tylenol  You may take 509mTylenol every 4-6 hours as needed for fever and pain  Follow-up with your primary care provider next week for recheck of symptoms if not improving.  Be sure to drink plenty of fluids and rest, at least 8hrs of sleep a night, preferably more while you are sick. Return urgent care or go to closest ER if you cannot keep down fluids/signs of dehydration, fever not reducing with Tylenol, difficulty breathing/wheezing, stiff neck, worsening condition, or other concerns (see below)  Please take antibiotics as prescribed and be sure to complete entire course even if you start to feel better to ensure infection does not come back.

## 2016-03-25 ENCOUNTER — Encounter: Payer: Self-pay | Admitting: Family Medicine

## 2016-03-25 ENCOUNTER — Other Ambulatory Visit: Payer: Self-pay | Admitting: Family Medicine

## 2016-03-25 ENCOUNTER — Ambulatory Visit (INDEPENDENT_AMBULATORY_CARE_PROVIDER_SITE_OTHER): Payer: BLUE CROSS/BLUE SHIELD | Admitting: Family Medicine

## 2016-03-25 VITALS — BP 131/67 | HR 59 | Ht 70.0 in | Wt 154.0 lb

## 2016-03-25 DIAGNOSIS — Z1231 Encounter for screening mammogram for malignant neoplasm of breast: Secondary | ICD-10-CM

## 2016-03-25 DIAGNOSIS — R251 Tremor, unspecified: Secondary | ICD-10-CM | POA: Diagnosis not present

## 2016-03-25 DIAGNOSIS — R450 Nervousness: Secondary | ICD-10-CM

## 2016-03-25 DIAGNOSIS — R4589 Other symptoms and signs involving emotional state: Secondary | ICD-10-CM | POA: Diagnosis not present

## 2016-03-25 NOTE — Progress Notes (Signed)
Subjective:    CC: Anxiety  HPI:  41 year old female comes in today complaining of anxiety. She says it's been worse in the mornings. She complains of feeling nervous anxious and on edge nearly every day as well as feeling restless. She also complains of some mild irritability several days of the week. Denies any major stressors. Quit her job in 2016. Says it feel like when she used to play basketball and right before a game she would get nervous.  She has tried to make some changes. She's been eating very healthy. She is making sure that she is getting at least 8 hours of sleep each night reports that she does sleep well. She's cut out all caffeine products. She is even tried meditating most days to help reduce her stress levels.  BP 131/67   Pulse (!) 59   Ht 5' 10"  (1.778 m)   Wt 154 lb (69.9 kg)   SpO2 98%   BMI 22.10 kg/m     Allergies  Allergen Reactions  . Morphine And Related Itching  . Percocet [Oxycodone-Acetaminophen] Itching and Swelling    Past Medical History:  Diagnosis Date  . H/O seasonal allergies     Past Surgical History:  Procedure Laterality Date  . LASIK Bilateral 05/13/11    Social History   Social History  . Marital status: Single    Spouse name: Alcide Clever  . Number of children: 0  . Years of education: N/A   Occupational History  . Manager for safety and [employer]   Social History Main Topics  . Smoking status: Former Research scientist (life sciences)  . Smokeless tobacco: Former Systems developer     Comment: Psychologist, sport and exercise  . Alcohol use Yes     Comment: 1-2 per wk  . Drug use: No  . Sexual activity: Not on file   Other Topics Concern  . Not on file   Social History Narrative   Exercises daily for 20-30 min.  No caffeine on regular basis.      Family History  Problem Relation Age of Onset  . Hypertension Father   . Dementia Father   . Alzheimer's disease Maternal Grandmother     Outpatient Encounter Prescriptions as of 03/25/2016  Medication  Sig  . [DISCONTINUED] amoxicillin-clavulanate (AUGMENTIN) 875-125 MG tablet Take 1 tablet by mouth 2 (two) times daily. One po bid x 7 days  . [DISCONTINUED] fluticasone (FLONASE) 50 MCG/ACT nasal spray Place 2 sprays into both nostrils daily.  . [DISCONTINUED] loratadine (CLARITIN) 10 MG tablet Take 10 mg by mouth daily.  . [DISCONTINUED] meloxicam (MOBIC) 15 MG tablet One tab PO qAM with breakfast for 2 weeks, then daily prn pain.  . [DISCONTINUED] Omega-3 Fatty Acids (FISH OIL) 500 MG CAPS Take 1,000 mg by mouth daily.  . [DISCONTINUED] valACYclovir (VALTREX) 1000 MG tablet Take 1 tablet (1,000 mg total) by mouth daily.  . [DISCONTINUED] valACYclovir (VALTREX) 500 MG tablet Take 500 mg by mouth 2 (two) times daily as needed.   No facility-administered encounter medications on file as of 03/25/2016.         Review of Systems: No fevers, chills, night sweats, weight loss, chest pain, or shortness of breath.   Objective:    General: Well Developed, well nourished, and in no acute distress.  Neuro: Alert and oriented x3, extra-ocular muscles intact, sensation grossly intact.  HEENT: Normocephalic, atraumatic  Skin: Warm and dry, no rashes. Cardiac: Regular rate and rhythm, no murmurs rubs  or gallops, no lower extremity edema.  Respiratory: Clear to auscultation bilaterally. Not using accessory muscles, speaking in full sentences.   Impression and Recommendations:    Feeling anxious-GAD 7 score of 9 today though she rates her symptoms as not difficult.Discussed that we will start with some blood work just to rule out thyroid disorder, likely disturbance etc. Since especially feels almost like there is an increased adrenaline sensation. If we can find anything underlying then consider that this could be more psychological. Explained that sometimes anxiety depression can come on later in life events she may not have had any major problems with it earlier. And sometimes it's not  circumstantial. Sometimes it really is more of a chemical imbalance of the brain. The main treatment would be some type of SSRI or S NRI. We will start with lab work and see how she does. She's really been a fantastic job and making sure that she is taking good care of herself by eating right, getting adequate sleep and avoiding caffeine.  Time spent 25 minutes,  50% time spent counseling about anxiety and workup.  He for screening mammogram as well.

## 2016-03-26 LAB — CBC WITH DIFFERENTIAL/PLATELET
Basophils Absolute: 0 {cells}/uL (ref 0–200)
Basophils Relative: 0 %
Eosinophils Absolute: 164 {cells}/uL (ref 15–500)
Eosinophils Relative: 2 %
HCT: 41.7 % (ref 35.0–45.0)
Hemoglobin: 13.7 g/dL (ref 11.7–15.5)
Lymphocytes Relative: 33 %
Lymphs Abs: 2706 {cells}/uL (ref 850–3900)
MCH: 30.7 pg (ref 27.0–33.0)
MCHC: 32.9 g/dL (ref 32.0–36.0)
MCV: 93.5 fL (ref 80.0–100.0)
MPV: 10.1 fL (ref 7.5–12.5)
Monocytes Absolute: 902 {cells}/uL (ref 200–950)
Monocytes Relative: 11 %
Neutro Abs: 4428 {cells}/uL (ref 1500–7800)
Neutrophils Relative %: 54 %
Platelets: 239 K/uL (ref 140–400)
RBC: 4.46 MIL/uL (ref 3.80–5.10)
RDW: 12.7 % (ref 11.0–15.0)
WBC: 8.2 K/uL (ref 3.8–10.8)

## 2016-03-26 LAB — COMPLETE METABOLIC PANEL WITH GFR
ALT: 11 U/L (ref 6–29)
AST: 14 U/L (ref 10–30)
Albumin: 4.5 g/dL (ref 3.6–5.1)
Alkaline Phosphatase: 38 U/L (ref 33–115)
BILIRUBIN TOTAL: 0.5 mg/dL (ref 0.2–1.2)
BUN: 16 mg/dL (ref 7–25)
CO2: 29 mmol/L (ref 20–31)
CREATININE: 0.89 mg/dL (ref 0.50–1.10)
Calcium: 9.5 mg/dL (ref 8.6–10.2)
Chloride: 105 mmol/L (ref 98–110)
GFR, Est African American: >89 mL/min (ref >=60)
GFR, Est Non African American: 81 mL/min (ref >=60)
GLUCOSE: 84 mg/dL (ref 65–99)
Potassium: 4.1 mmol/L (ref 3.5–5.3)
SODIUM: 141 mmol/L (ref 135–146)
TOTAL PROTEIN: 7.1 g/dL (ref 6.1–8.1)

## 2016-03-26 LAB — VITAMIN B12: Vitamin B-12: 384 pg/mL (ref 200–1100)

## 2016-03-26 LAB — VITAMIN D 25 HYDROXY (VIT D DEFICIENCY, FRACTURES): Vit D, 25-Hydroxy: 34 ng/mL (ref 30–100)

## 2016-03-26 LAB — TSH: TSH: 0.5 m[IU]/L

## 2016-03-26 LAB — FERRITIN: FERRITIN: 31 ng/mL (ref 10–232)

## 2016-03-26 LAB — CORTISOL: CORTISOL PLASMA: 7.6 ug/dL

## 2016-03-27 LAB — PROGESTERONE: PROGESTERONE: 8.3 ng/mL

## 2016-03-27 LAB — FSH/LH
FSH: 6.9 m[IU]/mL
LH: 10.1 m[IU]/mL

## 2016-03-27 LAB — ESTRADIOL: Estradiol: 112 pg/mL

## 2016-04-13 ENCOUNTER — Ambulatory Visit (INDEPENDENT_AMBULATORY_CARE_PROVIDER_SITE_OTHER): Payer: BLUE CROSS/BLUE SHIELD | Admitting: Family Medicine

## 2016-04-13 ENCOUNTER — Encounter: Payer: Self-pay | Admitting: Family Medicine

## 2016-04-13 VITALS — BP 125/61 | HR 63

## 2016-04-13 DIAGNOSIS — F32A Depression, unspecified: Secondary | ICD-10-CM

## 2016-04-13 DIAGNOSIS — F329 Major depressive disorder, single episode, unspecified: Secondary | ICD-10-CM | POA: Diagnosis not present

## 2016-04-13 DIAGNOSIS — R41840 Attention and concentration deficit: Secondary | ICD-10-CM

## 2016-04-13 NOTE — Patient Instructions (Signed)
Check out Seasonal Affective Disorder.

## 2016-04-13 NOTE — Progress Notes (Signed)
   Subjective:    Patient ID: Emily Herring, female    DOB: Apr 04, 1975, 41 y.o.   MRN: [id number]  HPI Here for 2 week f/u of anxiety. She is actually feeling better.  She recently had an endometrial ablation and wonder if some of her mood issue is hormonal.  She still having some intermittent thoughts of feeling down or sad or not wanting to be here but overall it seems to be better. She actually had her first full. Since her endometrial ablation 6 months ago and wonders if that may have been part of the issue. She did start vitamin D because we did note that her vitamin D levels were on the low end of normal. Have encouraged her to continue that through the winter months. She says she really wonders if this could also be seasonal. She says a lot of times this time year she'll feel down and sad.  She also thinks that she could have ADD. Her sister has been diagnosed with it. And she plans of issues with inattention on this her whole life. He has never been formally evaluated.   Review of Systems     Objective:   Physical Exam  Constitutional: She is oriented to person, place, and time. She appears well-developed and well-nourished.  HENT:  Head: Normocephalic and atraumatic.  Eyes: Conjunctivae and EOM are normal.  Cardiovascular: Normal rate.   Pulmonary/Chest: Effort normal.  Neurological: She is alert and oriented to person, place, and time.  Skin: Skin is dry. No pallor.  Psychiatric: She has a normal mood and affect. Her behavior is normal.  Vitals reviewed.       Assessment & Plan:  Anxiety-GAD 7 score of 8 today though he she rates her symptoms as not difficult. We discussed options including just giving it another month or 2 to see if her mood improved since she has noticed improvement over the last couple of weeks. Also is discussed that she could have seasonal affective disorder. She could look into light therapy is a reasonable option. We could also consider medication. She's  not interested at this time. Also discussed option of therapy/counseling but she doesn't feel it really would be much to discuss right now. Follow-up as needed.  Inattention - we discussed possible referral for adult ADD testing if she is interested. She can let me know.

## 2016-05-11 ENCOUNTER — Encounter: Payer: BLUE CROSS/BLUE SHIELD | Admitting: Family Medicine

## 2016-06-22 DIAGNOSIS — M9901 Segmental and somatic dysfunction of cervical region: Secondary | ICD-10-CM | POA: Diagnosis not present

## 2016-06-22 DIAGNOSIS — M9902 Segmental and somatic dysfunction of thoracic region: Secondary | ICD-10-CM | POA: Diagnosis not present

## 2016-06-22 DIAGNOSIS — M9903 Segmental and somatic dysfunction of lumbar region: Secondary | ICD-10-CM | POA: Diagnosis not present

## 2016-06-22 DIAGNOSIS — M9904 Segmental and somatic dysfunction of sacral region: Secondary | ICD-10-CM | POA: Diagnosis not present

## 2016-06-23 DIAGNOSIS — M9903 Segmental and somatic dysfunction of lumbar region: Secondary | ICD-10-CM | POA: Diagnosis not present

## 2016-06-23 DIAGNOSIS — M9901 Segmental and somatic dysfunction of cervical region: Secondary | ICD-10-CM | POA: Diagnosis not present

## 2016-06-23 DIAGNOSIS — M9902 Segmental and somatic dysfunction of thoracic region: Secondary | ICD-10-CM | POA: Diagnosis not present

## 2016-06-23 DIAGNOSIS — M9904 Segmental and somatic dysfunction of sacral region: Secondary | ICD-10-CM | POA: Diagnosis not present

## 2016-06-28 DIAGNOSIS — M9903 Segmental and somatic dysfunction of lumbar region: Secondary | ICD-10-CM | POA: Diagnosis not present

## 2016-06-28 DIAGNOSIS — M9902 Segmental and somatic dysfunction of thoracic region: Secondary | ICD-10-CM | POA: Diagnosis not present

## 2016-06-28 DIAGNOSIS — M9901 Segmental and somatic dysfunction of cervical region: Secondary | ICD-10-CM | POA: Diagnosis not present

## 2016-06-28 DIAGNOSIS — M9904 Segmental and somatic dysfunction of sacral region: Secondary | ICD-10-CM | POA: Diagnosis not present

## 2016-06-29 DIAGNOSIS — Z6828 Body mass index (BMI) 28.0-28.9, adult: Secondary | ICD-10-CM | POA: Diagnosis not present

## 2016-06-29 DIAGNOSIS — Z01419 Encounter for gynecological examination (general) (routine) without abnormal findings: Secondary | ICD-10-CM | POA: Diagnosis not present

## 2016-06-29 DIAGNOSIS — D352 Benign neoplasm of pituitary gland: Secondary | ICD-10-CM | POA: Diagnosis not present

## 2016-06-29 LAB — HM PAP SMEAR: HM PAP: NEGATIVE

## 2016-06-30 DIAGNOSIS — M9901 Segmental and somatic dysfunction of cervical region: Secondary | ICD-10-CM | POA: Diagnosis not present

## 2016-06-30 DIAGNOSIS — M9904 Segmental and somatic dysfunction of sacral region: Secondary | ICD-10-CM | POA: Diagnosis not present

## 2016-06-30 DIAGNOSIS — M9903 Segmental and somatic dysfunction of lumbar region: Secondary | ICD-10-CM | POA: Diagnosis not present

## 2016-06-30 DIAGNOSIS — M9902 Segmental and somatic dysfunction of thoracic region: Secondary | ICD-10-CM | POA: Diagnosis not present

## 2016-07-05 DIAGNOSIS — M9901 Segmental and somatic dysfunction of cervical region: Secondary | ICD-10-CM | POA: Diagnosis not present

## 2016-07-05 DIAGNOSIS — M9903 Segmental and somatic dysfunction of lumbar region: Secondary | ICD-10-CM | POA: Diagnosis not present

## 2016-07-05 DIAGNOSIS — M9902 Segmental and somatic dysfunction of thoracic region: Secondary | ICD-10-CM | POA: Diagnosis not present

## 2016-07-05 DIAGNOSIS — M9904 Segmental and somatic dysfunction of sacral region: Secondary | ICD-10-CM | POA: Diagnosis not present

## 2016-07-07 DIAGNOSIS — M9901 Segmental and somatic dysfunction of cervical region: Secondary | ICD-10-CM | POA: Diagnosis not present

## 2016-07-07 DIAGNOSIS — M9904 Segmental and somatic dysfunction of sacral region: Secondary | ICD-10-CM | POA: Diagnosis not present

## 2016-07-07 DIAGNOSIS — M9902 Segmental and somatic dysfunction of thoracic region: Secondary | ICD-10-CM | POA: Diagnosis not present

## 2016-07-07 DIAGNOSIS — M9903 Segmental and somatic dysfunction of lumbar region: Secondary | ICD-10-CM | POA: Diagnosis not present

## 2016-07-22 DIAGNOSIS — D352 Benign neoplasm of pituitary gland: Secondary | ICD-10-CM | POA: Diagnosis not present

## 2016-08-02 DIAGNOSIS — R0602 Shortness of breath: Secondary | ICD-10-CM | POA: Diagnosis not present

## 2016-08-02 DIAGNOSIS — Z885 Allergy status to narcotic agent status: Secondary | ICD-10-CM | POA: Diagnosis not present

## 2016-08-02 DIAGNOSIS — F17200 Nicotine dependence, unspecified, uncomplicated: Secondary | ICD-10-CM | POA: Diagnosis not present

## 2016-08-02 DIAGNOSIS — R12 Heartburn: Secondary | ICD-10-CM | POA: Diagnosis not present

## 2016-08-02 DIAGNOSIS — R079 Chest pain, unspecified: Secondary | ICD-10-CM | POA: Diagnosis not present

## 2016-08-02 DIAGNOSIS — R42 Dizziness and giddiness: Secondary | ICD-10-CM | POA: Diagnosis not present

## 2016-08-02 DIAGNOSIS — R0789 Other chest pain: Secondary | ICD-10-CM | POA: Diagnosis not present

## 2016-08-04 ENCOUNTER — Ambulatory Visit (INDEPENDENT_AMBULATORY_CARE_PROVIDER_SITE_OTHER): Payer: BLUE CROSS/BLUE SHIELD | Admitting: Ophthalmology

## 2016-08-04 DIAGNOSIS — H35413 Lattice degeneration of retina, bilateral: Secondary | ICD-10-CM

## 2016-08-04 DIAGNOSIS — H43813 Vitreous degeneration, bilateral: Secondary | ICD-10-CM | POA: Diagnosis not present

## 2016-08-04 DIAGNOSIS — H33333 Multiple defects of retina without detachment, bilateral: Secondary | ICD-10-CM | POA: Diagnosis not present

## 2016-08-04 DIAGNOSIS — D352 Benign neoplasm of pituitary gland: Secondary | ICD-10-CM

## 2016-08-26 DIAGNOSIS — Z885 Allergy status to narcotic agent status: Secondary | ICD-10-CM | POA: Diagnosis not present

## 2016-08-26 DIAGNOSIS — D352 Benign neoplasm of pituitary gland: Secondary | ICD-10-CM | POA: Diagnosis not present

## 2016-08-26 DIAGNOSIS — E221 Hyperprolactinemia: Secondary | ICD-10-CM | POA: Diagnosis not present

## 2016-08-30 ENCOUNTER — Ambulatory Visit (INDEPENDENT_AMBULATORY_CARE_PROVIDER_SITE_OTHER): Payer: BLUE CROSS/BLUE SHIELD | Admitting: Family Medicine

## 2016-08-30 ENCOUNTER — Encounter: Payer: Self-pay | Admitting: Family Medicine

## 2016-08-30 VITALS — BP 120/68 | HR 53 | Resp 16 | Wt 153.9 lb

## 2016-08-30 DIAGNOSIS — R7989 Other specified abnormal findings of blood chemistry: Secondary | ICD-10-CM

## 2016-08-30 DIAGNOSIS — Z13220 Encounter for screening for lipoid disorders: Secondary | ICD-10-CM

## 2016-08-30 DIAGNOSIS — E229 Hyperfunction of pituitary gland, unspecified: Secondary | ICD-10-CM | POA: Diagnosis not present

## 2016-08-30 DIAGNOSIS — R0789 Other chest pain: Secondary | ICD-10-CM

## 2016-08-30 DIAGNOSIS — Z1231 Encounter for screening mammogram for malignant neoplasm of breast: Secondary | ICD-10-CM | POA: Diagnosis not present

## 2016-08-30 NOTE — Progress Notes (Addendum)
   Subjective:    Patient ID: Emily Herring, female    DOB: October 13, 1975, 41 y.o.   MRN: [id number]  HPI 41 year old female comes in today to follow-up on chest pain. Infectious recently seen in urgent care on June 4 at [facility]. She was grooming a dog when she started to express chest pain, dizziness, and shortness of breath. At the emergency department she had a negative cardiac workup and a negative d-dimer to rule out pulmonary embolism.   She said she still continued to have some soreness across her upper chest for about 3 days but the more intense tenths chest pain that radiated into her back resolved pretty quickly after they gave her medicine in her IV. 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 She is doing well and has not had any recurrence. She has some occasional reflux but not very often. She did want to let me know about elevated prolactin levels. They have been mildly elevated for years. More recently her levels jumped up into the 60 range. They did a repeat MRI last week and she will have a follow-up appoint in consultation for first visit with endocrinology in October.   Review of Systems     Objective:   Physical Exam  Constitutional: She is oriented to person, place, and time. She appears well-developed and well-nourished.  HENT:  Head: Normocephalic and atraumatic.  Cardiovascular: Normal rate, regular rhythm and normal heart sounds.   Pulmonary/Chest: Effort normal and breath sounds normal.  Neurological: She is alert and oriented to person, place, and time.  Skin: Skin is warm and dry.  Psychiatric: She has a normal mood and affect. Her behavior is normal.      Assessment & Plan:  Atypical chest pain-fortunately she is doing well and ruled out for any type of cardiac or pulmonary embolus and type problem. She has not had any recurrence of symptoms. They felt was most likely musculoskeletal. Overall she is low risk for heart disease. No history of  hypertension or diabetes. I do not have a recent cholesterol level on her several order that and encourage her to go when she is able to fast.  Elevated prolactin levels-recent MRI was essentially normal with no sign of tumor or growth. She has consultation in October with endocrinology.  She also wanted to know if it was time to get her mammogram. Order placed for [facility].  Will update pap smear in chart.

## 2016-09-07 DIAGNOSIS — M9902 Segmental and somatic dysfunction of thoracic region: Secondary | ICD-10-CM | POA: Diagnosis not present

## 2016-09-07 DIAGNOSIS — M9904 Segmental and somatic dysfunction of sacral region: Secondary | ICD-10-CM | POA: Diagnosis not present

## 2016-09-07 DIAGNOSIS — M9903 Segmental and somatic dysfunction of lumbar region: Secondary | ICD-10-CM | POA: Diagnosis not present

## 2016-09-07 DIAGNOSIS — M9901 Segmental and somatic dysfunction of cervical region: Secondary | ICD-10-CM | POA: Diagnosis not present

## 2016-09-09 ENCOUNTER — Encounter: Payer: Self-pay | Admitting: Family Medicine

## 2016-09-15 ENCOUNTER — Encounter: Payer: Self-pay | Admitting: Family Medicine

## 2016-09-29 DIAGNOSIS — M9901 Segmental and somatic dysfunction of cervical region: Secondary | ICD-10-CM | POA: Diagnosis not present

## 2016-09-29 DIAGNOSIS — M9902 Segmental and somatic dysfunction of thoracic region: Secondary | ICD-10-CM | POA: Diagnosis not present

## 2016-09-29 DIAGNOSIS — M9903 Segmental and somatic dysfunction of lumbar region: Secondary | ICD-10-CM | POA: Diagnosis not present

## 2016-09-29 DIAGNOSIS — M9904 Segmental and somatic dysfunction of sacral region: Secondary | ICD-10-CM | POA: Diagnosis not present

## 2016-10-06 DIAGNOSIS — M9901 Segmental and somatic dysfunction of cervical region: Secondary | ICD-10-CM | POA: Diagnosis not present

## 2016-10-06 DIAGNOSIS — M9904 Segmental and somatic dysfunction of sacral region: Secondary | ICD-10-CM | POA: Diagnosis not present

## 2016-10-06 DIAGNOSIS — M9903 Segmental and somatic dysfunction of lumbar region: Secondary | ICD-10-CM | POA: Diagnosis not present

## 2016-10-06 DIAGNOSIS — M9902 Segmental and somatic dysfunction of thoracic region: Secondary | ICD-10-CM | POA: Diagnosis not present

## 2016-11-30 DIAGNOSIS — E221 Hyperprolactinemia: Secondary | ICD-10-CM | POA: Diagnosis not present

## 2016-12-16 DIAGNOSIS — Z1231 Encounter for screening mammogram for malignant neoplasm of breast: Secondary | ICD-10-CM | POA: Diagnosis not present

## 2016-12-16 LAB — HM MAMMOGRAPHY

## 2017-03-11 ENCOUNTER — Encounter: Payer: Self-pay | Admitting: Family Medicine

## 2017-05-23 DIAGNOSIS — M9903 Segmental and somatic dysfunction of lumbar region: Secondary | ICD-10-CM | POA: Diagnosis not present

## 2017-05-23 DIAGNOSIS — M9902 Segmental and somatic dysfunction of thoracic region: Secondary | ICD-10-CM | POA: Diagnosis not present

## 2017-05-23 DIAGNOSIS — M9901 Segmental and somatic dysfunction of cervical region: Secondary | ICD-10-CM | POA: Diagnosis not present

## 2017-05-23 DIAGNOSIS — M9904 Segmental and somatic dysfunction of sacral region: Secondary | ICD-10-CM | POA: Diagnosis not present

## 2017-07-21 ENCOUNTER — Emergency Department
Admission: EM | Admit: 2017-07-21 | Discharge: 2017-07-21 | Disposition: A | Discharge status: Home / Self Care | Payer: BLUE CROSS/BLUE SHIELD | Source: Home / Self Care | Attending: Family Medicine | Admitting: Family Medicine

## 2017-07-21 ENCOUNTER — Other Ambulatory Visit: Payer: Self-pay

## 2017-07-21 ENCOUNTER — Encounter: Payer: Self-pay | Admitting: Emergency Medicine

## 2017-07-21 DIAGNOSIS — J302 Other seasonal allergic rhinitis: Secondary | ICD-10-CM

## 2017-07-21 DIAGNOSIS — R059 Cough, unspecified: Secondary | ICD-10-CM

## 2017-07-21 DIAGNOSIS — R05 Cough: Secondary | ICD-10-CM

## 2017-07-21 MED ORDER — IPRATROPIUM BROMIDE 0.06 % NA SOLN: 2.0000 | Freq: Four times a day (QID) | NASAL | 1 refills | Status: DC

## 2017-07-21 MED ORDER — BENZONATATE 100 MG PO CAPS: 100.0000 mg | ORAL_CAPSULE | Freq: Three times a day (TID) | ORAL | 0 refills | Status: DC

## 2017-07-21 NOTE — ED Triage Notes (Signed)
Reports seasonal allergies starting to give her problems about 2 weeks ago; 4 days ago her cough became productive and her throat raw from coughing.

## 2017-07-21 NOTE — ED Provider Notes (Signed)
[facility]    CSN: [id number] Arrival date & time: 07/21/17  1251     History   Chief Complaint Chief Complaint  Patient presents with  . Cough  . Nasal Congestion  . Sore Throat    HPI Emily Herring is a 42 y.o. female.   HPI  Emily Herring is a 42 y.o. female presenting to UC with c/o sinus congestion that started about 2 weeks ago due to seasonal allergies.  She developed a productive cough about 4 days ago and a raw throat from coughing.  She has not been taking any OTC allergy medication yet but did try her friend's Flonase today without relief.   Denies facial pain, ear pain, chest pain, SOB, fever, chills, n/v/d.   Pt states besides the cough and congestion she feels well overall.      Past Medical History:  Diagnosis Date  . H/O seasonal allergies     Patient Active Problem List   Diagnosis Date Noted  . Idiopathic scoliosis 05/15/2014  . Midline back pain 05/15/2014  . Abnormality of gait 05/15/2014  . 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 Spinal stenosis of lumbar region 10/11/2013  . HERPES LABIALIS 09/18/2010  . DEPRESSION/ANXIETY 06/06/2008    Past Surgical History:  Procedure Laterality Date  . LASIK Bilateral 05/13/11    OB History   None      Home Medications    Prior to Admission medications   Medication Sig Start Date End Date Taking? Authorizing Provider  ranitidine (ZANTAC) 75 MG tablet Take 75 mg by mouth 2 (two) times daily.   Yes [provider]  benzonatate (TESSALON) 100 MG capsule Take 1-2 capsules (100-200 mg total) by mouth every 8 (eight) hours. 07/21/17   Noe Gens, PA-C  ipratropium (ATROVENT) 0.06 % nasal spray Place 2 sprays into both nostrils 4 (four) times daily. 07/21/17   Noe Gens, PA-C  OVER THE COUNTER MEDICATION CBD oil 15 mg daily    [provider]    Family History Family History  Problem Relation Age of Onset  . Hypertension Father   . Dementia Father   . Alzheimer's disease  Maternal Grandmother     Social History Social History   Tobacco Use  . Smoking status: Former Research scientist (life sciences)  . Smokeless tobacco: Former Systems developer  . Tobacco comment: e-cig  Substance Use Topics  . Alcohol use: Yes    Comment: 1-2 per wk  . Drug use: No     Allergies   Morphine and related and Percocet [oxycodone-acetaminophen]   Review of Systems Review of Systems  Constitutional: Negative for chills and fever.  HENT: Positive for congestion, postnasal drip and sore throat. Negative for ear pain, sinus pressure, sinus pain, trouble swallowing and voice change.   Respiratory: Positive for cough. Negative for shortness of breath.   Cardiovascular: Negative for chest pain and palpitations.  Gastrointestinal: Negative for abdominal pain, diarrhea, nausea and vomiting.  Musculoskeletal: Negative for arthralgias, back pain and myalgias.  Skin: Negative for rash.     Physical Exam Triage Vital Signs ED Triage Vitals [07/21/17 1337]  Enc Vitals Group     BP (!) 97/59     Pulse Rate 63     Resp 18     Temp 98.8 F (37.1 C)     Temp Source Oral     SpO2 98 %     Weight 153 lb (69.4 kg)     Height 5' 10"  (1.778 m)  Head Circumference      Peak Flow      Pain Score 0     Pain Loc      Pain Edu?      Excl. in [place]?    No data found.  Updated Vital Signs BP (!) 97/59 (BP Location: Right Arm)   Pulse 63   Temp 98.8 F (37.1 C) (Oral)   Resp 18   Ht 5' 10"  (1.778 m)   Wt 153 lb (69.4 kg)   LMP 06/23/2017 (Approximate)   SpO2 98%   BMI 21.95 kg/m   Visual Acuity Right Eye Distance:   Left Eye Distance:   Bilateral Distance:    Right Eye Near:   Left Eye Near:    Bilateral Near:     Physical Exam  Constitutional: She is oriented to person, place, and time. She appears well-developed and well-nourished.  Non-toxic appearance. She does not appear ill.  HENT:  Head: Normocephalic and atraumatic.  Right Ear: Tympanic membrane normal.  Left Ear: Tympanic membrane  normal.  Nose: Mucosal edema present. Right sinus exhibits no maxillary sinus tenderness and no frontal sinus tenderness. Left sinus exhibits no maxillary sinus tenderness and no frontal sinus tenderness.  Mouth/Throat: Uvula is midline, oropharynx is clear and moist and mucous membranes are normal.  Eyes: EOM are normal.  Neck: Normal range of motion. Neck supple.  Cardiovascular: Normal rate and regular rhythm.  Pulmonary/Chest: Effort normal and breath sounds normal. No stridor. She has no wheezes. She has no rhonchi.  Musculoskeletal: Normal range of motion.  Lymphadenopathy:    She has no cervical adenopathy.  Neurological: She is alert and oriented to person, place, and time.  Skin: Skin is warm and dry.  Psychiatric: She has a normal mood and affect. Her behavior is normal.  Nursing note and vitals reviewed.    UC Treatments / Results  Labs (all labs ordered are listed, but only abnormal results are displayed) Labs Reviewed - No data to display  EKG None  Radiology No results found.  Procedures Procedures (including critical care time)  Medications Ordered in UC Medications - No data to display  Initial Impression / Assessment and Plan / UC Course  I have reviewed the triage vital signs and the nursing notes.  Pertinent labs & imaging results that were available during my care of the patient were reviewed by me and considered in my medical decision making (see chart for details).     No evidence of bacterial infection at this time. Encouraged symptomatic treatment.   Final Clinical Impressions(s) / UC Diagnoses   Final diagnoses:  Seasonal allergic rhinitis, unspecified trigger  Cough     Discharge Instructions      You may take 591m acetaminophen every 4-6 hours or in combination with ibuprofen 400-6091mevery 6-8 hours as needed for pain, inflammation, and fever.  Be sure to drink at least eight 8oz glasses of water to stay well hydrated and get at  least 8 hours of sleep at night, preferably more while sick.   Please follow up with primary care in 7-10 days if not improving.    ED Prescriptions    Medication Sig Dispense Auth. Provider   ipratropium (ATROVENT) 0.06 % nasal spray Place 2 sprays into both nostrils 4 (four) times daily. 15 mL Makayli Bracken O, PA-C   benzonatate (TESSALON) 100 MG capsule Take 1-2 capsules (100-200 mg total) by mouth every 8 (eight) hours. 21 capsule [place]e [place]  Controlled Substance Prescriptions [place] Controlled Substance Registry consulted? Not Applicable   Tyrell Antonio 07/21/17 1540

## 2017-07-21 NOTE — Discharge Instructions (Signed)
°  You may take 539m acetaminophen every 4-6 hours or in combination with ibuprofen 400-6030mevery 6-8 hours as needed for pain, inflammation, and fever.  Be sure to drink at least eight 8oz glasses of water to stay well hydrated and get at least 8 hours of sleep at night, preferably more while sick.   Please follow up with primary care in 7-10 days if not improving.

## 2017-08-10 ENCOUNTER — Ambulatory Visit (INDEPENDENT_AMBULATORY_CARE_PROVIDER_SITE_OTHER): Payer: BLUE CROSS/BLUE SHIELD | Admitting: Ophthalmology

## 2017-08-11 ENCOUNTER — Ambulatory Visit (INDEPENDENT_AMBULATORY_CARE_PROVIDER_SITE_OTHER): Payer: BLUE CROSS/BLUE SHIELD | Admitting: Ophthalmology

## 2017-08-11 DIAGNOSIS — D352 Benign neoplasm of pituitary gland: Secondary | ICD-10-CM | POA: Diagnosis not present

## 2017-08-11 DIAGNOSIS — H33303 Unspecified retinal break, bilateral: Secondary | ICD-10-CM

## 2017-08-11 DIAGNOSIS — H43813 Vitreous degeneration, bilateral: Secondary | ICD-10-CM

## 2017-10-21 DIAGNOSIS — J302 Other seasonal allergic rhinitis: Secondary | ICD-10-CM | POA: Diagnosis not present

## 2017-10-21 DIAGNOSIS — Z01419 Encounter for gynecological examination (general) (routine) without abnormal findings: Secondary | ICD-10-CM | POA: Diagnosis not present

## 2017-10-21 DIAGNOSIS — Z8619 Personal history of other infectious and parasitic diseases: Secondary | ICD-10-CM | POA: Diagnosis not present

## 2017-10-21 DIAGNOSIS — Z9889 Other specified postprocedural states: Secondary | ICD-10-CM | POA: Diagnosis not present

## 2017-12-01 DIAGNOSIS — E221 Hyperprolactinemia: Secondary | ICD-10-CM | POA: Diagnosis not present

## 2018-01-17 DIAGNOSIS — Z1231 Encounter for screening mammogram for malignant neoplasm of breast: Secondary | ICD-10-CM | POA: Diagnosis not present

## 2018-01-17 LAB — HM MAMMOGRAPHY

## 2018-02-08 ENCOUNTER — Emergency Department (INDEPENDENT_AMBULATORY_CARE_PROVIDER_SITE_OTHER): Payer: BLUE CROSS/BLUE SHIELD

## 2018-02-08 ENCOUNTER — Other Ambulatory Visit: Payer: Self-pay

## 2018-02-08 ENCOUNTER — Emergency Department
Admission: EM | Admit: 2018-02-08 | Discharge: 2018-02-08 | Disposition: A | Discharge status: Home / Self Care | Payer: BLUE CROSS/BLUE SHIELD | Source: Home / Self Care | Attending: Family Medicine | Admitting: Family Medicine

## 2018-02-08 DIAGNOSIS — R197 Diarrhea, unspecified: Secondary | ICD-10-CM

## 2018-02-08 DIAGNOSIS — K529 Noninfective gastroenteritis and colitis, unspecified: Secondary | ICD-10-CM

## 2018-02-08 DIAGNOSIS — D252 Subserosal leiomyoma of uterus: Secondary | ICD-10-CM

## 2018-02-08 DIAGNOSIS — G8929 Other chronic pain: Secondary | ICD-10-CM

## 2018-02-08 DIAGNOSIS — R1031 Right lower quadrant pain: Secondary | ICD-10-CM

## 2018-02-08 LAB — POCT URINALYSIS DIP (MANUAL ENTRY)
Glucose, UA: NEGATIVE mg/dL
Leukocytes, UA: NEGATIVE
Nitrite, UA: NEGATIVE
Spec Grav, UA: >=1.03 — AB (ref 1.010–1.025)
Urobilinogen, UA: 0.2 E.U./dL
pH, UA: 5.5 (ref 5.0–8.0)

## 2018-02-08 LAB — POCT CBC W AUTO DIFF (K'VILLE URGENT CARE)

## 2018-02-08 MED ORDER — CIPROFLOXACIN HCL 500 MG PO TABS: 500.0000 mg | ORAL_TABLET | Freq: Two times a day (BID) | ORAL | 0 refills | Status: DC

## 2018-02-08 MED ORDER — KETOROLAC TROMETHAMINE 60 MG/2ML IM SOLN
60.0000 mg | Freq: Once | INTRAMUSCULAR | Status: AC
  Administered 2018-02-08: 60 mg via INTRAMUSCULAR

## 2018-02-08 MED ORDER — PROMETHAZINE HCL 25 MG PO TABS: 25.0000 mg | ORAL_TABLET | Freq: Four times a day (QID) | ORAL | 0 refills | Status: DC | PRN

## 2018-02-08 MED ORDER — IOPAMIDOL (ISOVUE-300) INJECTION 61%: 30.0000 mL | Freq: Once | INTRAVENOUS | Status: DC | PRN

## 2018-02-08 MED ORDER — IOPAMIDOL (ISOVUE-300) INJECTION 61%
100.0000 mL | Freq: Once | INTRAVENOUS | Status: AC | PRN
  Administered 2018-02-08: 100 mL via INTRAVENOUS

## 2018-02-08 MED ORDER — TRAMADOL HCL 50 MG PO TABS: ORAL_TABLET | ORAL | 0 refills | Status: DC

## 2018-02-08 MED ORDER — METRONIDAZOLE 500 MG PO TABS: ORAL_TABLET | ORAL | 0 refills | Status: DC

## 2018-02-08 NOTE — ED Provider Notes (Signed)
[facility]    CSN: [id number] Arrival date & time: 02/08/18  0803     History   Chief Complaint Chief Complaint  Patient presents with  . Nausea  . Diarrhea    HPI Emily Herring is a 42 y.o. female.   Patient developed greenish stools three days ago, followed by watery diarrhea and right lower quadrant pain, migrating to her mid-lower abdomen.  She has had nausea without vomiting, and continues to have numerous watery stools daily.  Yesterday she had chills/sweats and fever to 101.3 last night, and 100.2 this morning.  Patient's last menstrual period began 3 days ago.  She denies urinary symptoms. She reports that she has had vague right flank ache for about 2 weeks. She passed a kidney stone one month ago (has not had a CT renal scan). Past surgical history of endometrial ablation 2017.  The history is provided by the patient.  Abdominal Pain  Pain location:  Suprapubic and RLQ Pain quality: sharp and stabbing   Pain radiates to:  R flank Pain severity:  Moderate Onset quality:  Sudden Duration:  2 days Timing:  Intermittent Progression:  Worsening Chronicity:  New Context: awakening from sleep and previous surgery   Context: not diet changes, not eating, not recent illness, not recent travel, not sick contacts, not suspicious food intake and not trauma   Relieved by:  Nothing Worsened by:  Movement and palpation Ineffective treatments:  Acetaminophen Associated symptoms: anorexia, chills, diarrhea, fatigue, fever and nausea   Associated symptoms: no chest pain, no cough, no dysuria, no flatus, no hematemesis, no hematochezia, no hematuria, no melena, no shortness of breath, no vaginal bleeding, no vaginal discharge and no vomiting     Past Medical History:  Diagnosis Date  . H/O seasonal allergies     Patient Active Problem List   Diagnosis Date Noted  . Idiopathic scoliosis 05/15/2014  . Midline back pain 05/15/2014  . Abnormality of gait  05/15/2014  . 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 Spinal stenosis of lumbar region 10/11/2013  . HERPES LABIALIS 09/18/2010  . DEPRESSION/ANXIETY 06/06/2008    Past Surgical History:  Procedure Laterality Date  . LASIK Bilateral 05/13/11    OB History   None      Home Medications    Prior to Admission medications   Medication Sig Start Date End Date Taking? Authorizing Provider  benzonatate (TESSALON) 100 MG capsule Take 1-2 capsules (100-200 mg total) by mouth every 8 (eight) hours. 07/21/17   Noe Gens, PA-C  ciprofloxacin (CIPRO) 500 MG tablet Take 1 tablet (500 mg total) by mouth 2 (two) times daily. 02/08/18   Kandra Nicolas, MD  ipratropium (ATROVENT) 0.06 % nasal spray Place 2 sprays into both nostrils 4 (four) times daily. 07/21/17   Noe Gens, PA-C  metroNIDAZOLE (FLAGYL) 500 MG tablet Take one tab PO every 8 hours 02/08/18   Kandra Nicolas, MD  OVER THE COUNTER MEDICATION CBD oil 15 mg daily    [provider]  promethazine (PHENERGAN) 25 MG tablet Take 1 tablet (25 mg total) by mouth every 6 (six) hours as needed for nausea or vomiting. 02/08/18   Kandra Nicolas, MD  ranitidine (ZANTAC) 75 MG tablet Take 75 mg by mouth 2 (two) times daily.    [provider]  traMADol Veatrice Bourbon) 50 MG tablet Take one tab PO at bedtime as needed for pain 02/08/18   Kandra Nicolas, MD    Family History Family History  Problem Relation Age of Onset  . Hypertension Father   . Dementia Father   . Alzheimer's disease Maternal Grandmother     Social History Social History   Tobacco Use  . Smoking status: Former Research scientist (life sciences)  . Smokeless tobacco: Former Systems developer  . Tobacco comment: e-cig  Substance Use Topics  . Alcohol use: Yes    Comment: 1-2 per wk  . Drug use: No     Allergies   Morphine and related and Percocet [oxycodone-acetaminophen]   Review of Systems Review of Systems  Constitutional: Positive for chills, fatigue and fever.  Respiratory:  Negative for cough and shortness of breath.   Cardiovascular: Negative for chest pain.  Gastrointestinal: Positive for abdominal pain, anorexia, diarrhea and nausea. Negative for flatus, hematemesis, hematochezia, melena and vomiting.  Genitourinary: Negative for dysuria, hematuria, vaginal bleeding and vaginal discharge.     Physical Exam Triage Vital Signs ED Triage Vitals  Enc Vitals Group     BP 02/08/18 0839 119/85     Pulse Rate 02/08/18 0839 83     Resp 02/08/18 0839 18     Temp 02/08/18 0839 98.6 F (37 C)     Temp Source 02/08/18 0839 Oral     SpO2 02/08/18 0839 96 %     Weight 02/08/18 0841 154 lb (69.9 kg)     Height 02/08/18 0841 5' 10"  (1.778 m)     Head Circumference --      Peak Flow --      Pain Score 02/08/18 0836 10     Pain Loc --      Pain Edu? --      Excl. in [place]? --    No data found.  Updated Vital Signs BP 119/85 (BP Location: Right Arm)   Pulse 83   Temp 98.6 F (37 C) (Oral)   Resp 18   Ht 5' 10"  (1.778 m)   Wt 69.9 kg   LMP 02/06/2018   SpO2 96%   BMI 22.10 kg/m   Visual Acuity Right Eye Distance:   Left Eye Distance:   Bilateral Distance:    Right Eye Near:   Left Eye Near:    Bilateral Near:     Physical Exam  Constitutional: She appears well-developed and well-nourished. No distress.  HENT:  Head: Normocephalic.  Right Ear: External ear normal.  Left Ear: External ear normal.  Nose: Nose normal.  Mouth/Throat: Oropharynx is clear and moist.  Eyes: Pupils are equal, round, and reactive to light. Conjunctivae are normal.  Neck: Neck supple.  Tender left lateral nodes.  Cardiovascular: Normal heart sounds.  Pulmonary/Chest: Breath sounds normal.  Abdominal: Soft. Normal appearance and bowel sounds are normal. She exhibits no distension. There is no hepatosplenomegaly. There is tenderness at McBurney's point. There is no rigidity, no rebound, no CVA tenderness and negative Murphy's sign.    Tenderness is predominantly left  peri-umbilical with tenderness to deep palpation at McBurney's point. Negative iliopsoas and obdurator tests.  Musculoskeletal: She exhibits no edema.  Neurological: She is alert.  Skin: Skin is warm and dry. No rash noted.  Nursing note and vitals reviewed.    UC Treatments / Results  Labs (all labs ordered are listed, but only abnormal results are displayed) Labs Reviewed  POCT CBC W AUTO DIFF ([facility]):  WBC 12.6; LY 7.2; MO 4.0; GR 88.8; Hgb 13.0; Platelets 219   POCT URINALYSIS DIP (MANUAL ENTRY): BIL Small; KET 40 mg/dl; SG >= 1.030; BLO Small; pH 5.5;  PRO Trace; NIT Negative; LEU Negative    EKG None  Radiology Ct Abdomen Pelvis W Contrast  Result Date: 02/08/2018 CLINICAL DATA:  Right-sided abdominal pain with diarrhea. EXAM: CT ABDOMEN AND PELVIS WITH CONTRAST TECHNIQUE: Multidetector CT imaging of the abdomen and pelvis was performed using the standard protocol following bolus administration of intravenous contrast. CONTRAST:  129m ISOVUE-300 IOPAMIDOL (ISOVUE-300) INJECTION 61% COMPARISON:  CT abdomen pelvis dated October 31, 2012. FINDINGS: Lower chest: No acute abnormality. Hepatobiliary: No focal liver abnormality is seen. No gallstones, gallbladder wall thickening, or biliary dilatation. Pancreas: Unremarkable. No pancreatic ductal dilatation or surrounding inflammatory changes. Spleen: Normal in size without focal abnormality. Adrenals/Urinary Tract: Adrenal glands are unremarkable. Kidneys are normal, without renal calculi, focal lesion, or hydronephrosis. Mild circumferential bladder wall thickening is likely related to underdistention. Stomach/Bowel: The stomach and small bowel are unremarkable. Mild to moderate wall thickening of the right colon with minimal pericolonic stranding. No obstruction. Normal appendix. Vascular/Lymphatic: No significant vascular findings are present. No enlarged abdominal or pelvic lymph nodes. Reproductive: 2.8 cm subserosal  fundal uterine fibroid. Bilateral adnexa are unremarkable. Other: Trace free fluid in the pelvis, likely physiologic. No pneumoperitoneum. Musculoskeletal: No acute or significant osseous findings. IMPRESSION: 1. Wall thickening of the right colon, consistent with colitis. 2. Normal appendix. 3. Fibroid uterus. Electronically Signed   By: WTitus DubinM.D.   On: 02/08/2018 11:40    Procedures Procedures (including critical care time)  Medications Ordered in UC Medications  ketorolac (TORADOL) injection 60 mg (60 mg Intramuscular Given 02/08/18 0924)    Initial Impression / Assessment and Plan / UC Course  I have reviewed the triage vital signs and the nursing notes.  Pertinent labs & imaging results that were available during my care of the patient were reviewed by me and considered in my medical decision making (see chart for details).    Administered Toradol 652mIM Check GI panel by PCR, and C. Diff by PCR.  CMP pending. Begin empiric Flagyl and Cipro   (obtain stool specimen before beginning antibiotics).  Rx for Phenergan for nausea (advised to use sparingly).  Rx for Tramadol 5072mt bedtime prn (#5, no refill)  Followup with Family Doctor in about 5 days.   Final Clinical Impressions(s) / UC Diagnoses   Final diagnoses:  Colitis  Diarrhea, unspecified type  Abdominal pain, chronic, right lower quadrant     Discharge Instructions     Begin clear liquids (Pedialyte while having diarrhea) until improved, then advance to a BR[employer]ananas, Rice, Applesauce, Toast).  Then gradually resume a regular diet when tolerated.  Avoid milk products until well.     When stools become more formed, may take Imodium (loperamide) once or twice daily to decrease stool frequency.  May take Tylenol as needed for pain.  If symptoms become significantly worse during the night or over the weekend, proceed to the local emergency room.     ED Prescriptions    Medication Sig Dispense Auth.  Provider   ciprofloxacin (CIPRO) 500 MG tablet Take 1 tablet (500 mg total) by mouth 2 (two) times daily. 14 tablet BeeKandra NicolasD   metroNIDAZOLE (FLAGYL) 500 MG tablet Take one tab PO every 8 hours 21 tablet BeeKandra NicolasD   promethazine (PHENERGAN) 25 MG tablet Take 1 tablet (25 mg total) by mouth every 6 (six) hours as needed for nausea or vomiting. 10 tablet BeeKandra NicolasD   traMADol (ULTRAM) 50 MG tablet Take one  tab PO at bedtime as needed for pain 5 tablet Kandra Nicolas, MD         Kandra Nicolas, MD 02/08/18 [phone]

## 2018-02-08 NOTE — ED Triage Notes (Signed)
Had dk to greenish stools on Sunday, then diarrhea and stomach pain.  Pain started in the right side and rotated up and to the middle.  Denies vomiting.  Fever as high as 101.3 yesterday.

## 2018-02-08 NOTE — Discharge Instructions (Addendum)
Begin clear liquids (Pedialyte while having diarrhea) until improved, then advance to a [employer] (Bananas, Rice, Applesauce, Toast).  Then gradually resume a regular diet when tolerated.  Avoid milk products until well.     When stools become more formed, may take Imodium (loperamide) once or twice daily to decrease stool frequency.  May take Tylenol as needed for pain.  If symptoms become significantly worse during the night or over the weekend, proceed to the local emergency room.

## 2018-02-09 LAB — C. DIFFICILE GDH AND TOXIN A/B
GDH ANTIGEN: NOT DETECTED
MICRO NUMBER:: 91484264
SPECIMEN QUALITY:: ADEQUATE
TOXIN A AND B: NOT DETECTED

## 2018-02-09 LAB — COMPLETE METABOLIC PANEL WITH GFR
AG Ratio: 1.6 (calc) (ref 1.0–2.5)
ALT: 10 U/L (ref 6–29)
AST: 15 U/L (ref 10–30)
Albumin: 4.2 g/dL (ref 3.6–5.1)
Alkaline phosphatase (APISO): 46 U/L (ref 33–115)
BUN: 17 mg/dL (ref 7–25)
CHLORIDE: 101 mmol/L (ref 98–110)
CO2: 26 mmol/L (ref 20–32)
Calcium: 9 mg/dL (ref 8.6–10.2)
Creat: 1.02 mg/dL (ref 0.50–1.10)
GFR, Est African American: 78 mL/min/{1.73_m2} (ref >=60)
GFR, Est Non African American: 67 mL/min/{1.73_m2} (ref >=60)
Globulin: 2.7 g/dL (calc) (ref 1.9–3.7)
Glucose, Bld: 89 mg/dL (ref 65–99)
Potassium: 3.6 mmol/L (ref 3.5–5.3)
Sodium: 138 mmol/L (ref 135–146)
Total Bilirubin: 0.5 mg/dL (ref 0.2–1.2)
Total Protein: 6.9 g/dL (ref 6.1–8.1)

## 2018-02-09 LAB — GASTROINTESTINAL PATHOGEN PANEL PCR
C. difficile Tox A/B, PCR: NOT DETECTED
Campylobacter, PCR: DETECTED — AB
Cryptosporidium, PCR: NOT DETECTED
E COLI 0157, PCR: NOT DETECTED
E coli (ETEC) LT/ST PCR: NOT DETECTED
E coli (STEC) stx1/stx2, PCR: NOT DETECTED
Giardia lamblia, PCR: NOT DETECTED
Norovirus, PCR: NOT DETECTED
Rotavirus A, PCR: NOT DETECTED
Salmonella, PCR: NOT DETECTED
Shigella, PCR: NOT DETECTED

## 2018-02-09 LAB — TIQ-NTM

## 2018-02-09 LAB — URINE CULTURE
MICRO NUMBER:: 91483804
Result:: NO GROWTH
SPECIMEN QUALITY:: ADEQUATE

## 2018-02-10 ENCOUNTER — Telehealth: Payer: Self-pay | Admitting: Emergency Medicine

## 2018-02-10 NOTE — Telephone Encounter (Signed)
Patient did call back and was prescribed azithromycin 500 mg po qd x 3 days. She will follow with [facility].

## 2018-02-10 NOTE — Telephone Encounter (Signed)
Message left on voice mail inquiring about patient's status and encouraging patient to call with questions/concerns; requested her to call us for lab results.

## 2018-02-16 ENCOUNTER — Encounter: Payer: Self-pay | Admitting: Family Medicine

## 2018-02-28 ENCOUNTER — Ambulatory Visit (INDEPENDENT_AMBULATORY_CARE_PROVIDER_SITE_OTHER): Payer: BLUE CROSS/BLUE SHIELD | Admitting: Family Medicine

## 2018-02-28 VITALS — BP 117/66 | HR 64 | Ht 70.0 in | Wt 154.0 lb

## 2018-02-28 DIAGNOSIS — K529 Noninfective gastroenteritis and colitis, unspecified: Secondary | ICD-10-CM

## 2018-02-28 DIAGNOSIS — A045 Campylobacter enteritis: Secondary | ICD-10-CM | POA: Diagnosis not present

## 2018-02-28 NOTE — Progress Notes (Signed)
Subjective:    CC: F/U colitis.   HPI:  Emily Herring is a 42 year old female who comes in today to follow-up on colitis.  She was seen at our urgent care on December 11th for nausea watery green stools and right lower quadrant pain.  She was also running a temperature to 101.  CT scan was ordered as she had had a prior history of kidney stones there was concern for possible appendicitis at that time.  Pt had CT done and they found something on the scan at her R kidney she questions if it may be a kidney stone. she said that she has some pain in the mornings @ her 1st void she does admit that she has not experienced this for several months.  Stool pathogen panel revealed Campylobacter.  Her antibiotics were switched to azithromycin on December 13.  She took that for 3 days.  Her pain has resolved almost completely.  She is no longer having diarrhea in fact she says that stopped within a day of completing the antibiotics.  No fevers or chills or sweats.  Overall she is feeling significantly better.  Past medical history, Surgical history, Family history not pertinant except as noted below, Social history, Allergies, and medications have been entered into the medical record, reviewed, and corrections made.   Review of Systems: No fevers, chills, night sweats, weight loss, chest pain, or shortness of breath.   Objective:    General: Well Developed, well nourished, and in no acute distress.  Neuro: Alert and oriented x3, extra-ocular muscles intact, sensation grossly intact.  HEENT: Normocephalic, atraumatic  Skin: Warm and dry, no rashes. Cardiac: Regular rate and rhythm, no murmurs rubs or gallops, no lower extremity edema.  Respiratory: Clear to auscultation bilaterally. Not using accessory muscles, speaking in full sentences. Abd: soft, bowel sounds, still mildly tender in the right lower quadrant.  No mass.   Impression and Recommendations:   Colitis-she is significantly improved from baseline and  has completed her antibiotics.  She was positive for Campylobacter on her GI panel.  She still has some tenderness on exam in that right lower quadrant reassured her that that should likely improve over the next couple of weeks but if at any point she feels like her symptoms are recurring then please let us know.

## 2018-05-22 ENCOUNTER — Other Ambulatory Visit: Payer: Self-pay | Admitting: *Deleted

## 2018-05-22 DIAGNOSIS — Z0000 Encounter for general adult medical examination without abnormal findings: Secondary | ICD-10-CM

## 2018-05-23 ENCOUNTER — Ambulatory Visit (INDEPENDENT_AMBULATORY_CARE_PROVIDER_SITE_OTHER): Payer: BLUE CROSS/BLUE SHIELD | Admitting: Family Medicine

## 2018-05-23 ENCOUNTER — Encounter: Payer: Self-pay | Admitting: Family Medicine

## 2018-05-23 ENCOUNTER — Other Ambulatory Visit: Payer: Self-pay

## 2018-05-23 VITALS — HR 70 | Temp 95.5°F | Ht 70.0 in | Wt 152.0 lb

## 2018-05-23 DIAGNOSIS — Z124 Encounter for screening for malignant neoplasm of cervix: Secondary | ICD-10-CM | POA: Diagnosis not present

## 2018-05-23 DIAGNOSIS — F411 Generalized anxiety disorder: Secondary | ICD-10-CM

## 2018-05-23 DIAGNOSIS — E221 Hyperprolactinemia: Secondary | ICD-10-CM | POA: Insufficient documentation

## 2018-05-23 LAB — COMPLETE METABOLIC PANEL WITH GFR
AG Ratio: 1.8 (calc) (ref 1.0–2.5)
ALKALINE PHOSPHATASE (APISO): 53 U/L (ref 31–125)
ALT: 14 U/L (ref 6–29)
AST: 17 U/L (ref 10–30)
Albumin: 4.6 g/dL (ref 3.6–5.1)
BUN: 16 mg/dL (ref 7–25)
CO2: 31 mmol/L (ref 20–32)
CREATININE: 1.1 mg/dL (ref 0.50–1.10)
Calcium: 9.7 mg/dL (ref 8.6–10.2)
Chloride: 101 mmol/L (ref 98–110)
GFR, Est African American: 71 mL/min/{1.73_m2} (ref >=60)
GFR, Est Non African American: 61 mL/min/{1.73_m2} (ref >=60)
Globulin: 2.6 g/dL (calc) (ref 1.9–3.7)
Glucose, Bld: 162 mg/dL — ABNORMAL HIGH (ref 65–99)
Potassium: 4.1 mmol/L (ref 3.5–5.3)
Sodium: 139 mmol/L (ref 135–146)
Total Bilirubin: 0.6 mg/dL (ref 0.2–1.2)
Total Protein: 7.2 g/dL (ref 6.1–8.1)

## 2018-05-23 LAB — LIPID PANEL
Cholesterol: 158 mg/dL (ref <200)
HDL: 71 mg/dL (ref >=50)
LDL Cholesterol (Calc): 60 mg/dL (calc)
Non-HDL Cholesterol (Calc): 87 mg/dL (calc) (ref <130)
Total CHOL/HDL Ratio: 2.2 (calc) (ref <5.0)
Triglycerides: 198 mg/dL — ABNORMAL HIGH (ref <150)

## 2018-05-23 MED ORDER — ALPRAZOLAM 0.5 MG PO TABS: 0.2500 mg | ORAL_TABLET | Freq: Every day | ORAL | 0 refills | Status: DC | PRN

## 2018-05-23 MED ORDER — SERTRALINE HCL 50 MG PO TABS: ORAL_TABLET | ORAL | 1 refills | Status: DC

## 2018-05-23 NOTE — Progress Notes (Signed)
Virtual Visit via Video Note  I connected with Emily Herring on 05/23/18 at  3:40 PM EDT by a video enabled telemedicine application and verified that I am speaking with the correct person using two identifiers.   I discussed the limitations of evaluation and management by telemedicine and the availability of in person appointments. The patient expressed understanding and agreed to proceed.  History of Present Illness: She has been feeling very anxious for the last 1.5 weeks.  Around 2/13 it was the anniversary of a friends death/suicide and she has had some stress around the current pandemic as well. She is waking up feeling stressed and panicky.  She starts to get anxious thoughts in her head and they can even be over something simple but cannot quite shake it.  She is a groomer for a living.  She has had diarrhea with it as well.  She woke up in the middle of the night feeling shaky and nervous. No hx of thyroid problems.   Says has episodes like this before. She says most of the time she can really control her anxiety. She eats very healthy and exercise. She tries deep breathing and trying to think about happy things.    She also wanted let me know that her OB/GYN in [place] has retired.  She would like to get established here with somebody locally.   Observations/Objective: Speaking normally. Reasoning normally.   Vitals:   05/23/18 1426  Weight: 152 lb (68.9 kg)  Height: 5' 10"  (1.778 m)     Assessment and Plan:  GAD -we discussed options including prophylactic medication.  She has never taken anything in the past.  We will start with sertraline.  Warned about potential side effects.  Also refilled alprazolam she has had a few tabs of this in the past she was given a few years ago she said she used it very sparingly and does not were having any ill effects.  Did warn about potential for sedation.  Plan to follow-up via tele-visit in 3 to 4 weeks or in person if able to.  Will refer  to OB/GYN down the hall for routine care including cervical cancer screening.  Follow Up Instructions: .   I discussed the assessment and treatment plan with the patient. The patient was provided an opportunity to ask questions and all were answered. The patient agreed with the plan and demonstrated an understanding of the instructions.   The patient was advised to call back or seek an in-person evaluation if the symptoms worsen or if the condition fails to improve as anticipated.  I provided 21 minutes of non-face-to-face time during this encounter.   Beatrice Lecher, MD   Depression screen [facility] 2/9 05/23/2018 05/15/2014 05/15/2014  Decreased Interest 0 0 0  Down, Depressed, Hopeless 1 0 0  PHQ - 2 Score 1 0 0

## 2018-05-29 ENCOUNTER — Encounter: Payer: BLUE CROSS/BLUE SHIELD | Admitting: Family Medicine

## 2018-05-29 ENCOUNTER — Telehealth: Payer: Self-pay

## 2018-05-29 NOTE — Telephone Encounter (Signed)
Is she taking half a tab?  Or whole tab?

## 2018-05-29 NOTE — Telephone Encounter (Signed)
Emily Herring states since she started the Sertraline she hs had diarrhea in the morning and increased anxiety. She is taking as directed. Please advise.

## 2018-05-29 NOTE — Telephone Encounter (Signed)
If symptoms are mild see if she would be willing to stay on the half a tab for a week to see if it gets better if not, then I can change it.  Do not mind either way.  A lot of times symptoms such as the stools and the increased anxiety usually gets better after the first week.

## 2018-05-29 NOTE — Telephone Encounter (Signed)
Patient advised.

## 2018-05-29 NOTE — Telephone Encounter (Signed)
She is taking 1/2 tablet daily.

## 2018-06-14 ENCOUNTER — Other Ambulatory Visit: Payer: Self-pay | Admitting: *Deleted

## 2018-06-14 ENCOUNTER — Other Ambulatory Visit: Payer: Self-pay | Admitting: Family Medicine

## 2018-06-19 ENCOUNTER — Telehealth (INDEPENDENT_AMBULATORY_CARE_PROVIDER_SITE_OTHER): Payer: BLUE CROSS/BLUE SHIELD | Admitting: Family Medicine

## 2018-06-19 ENCOUNTER — Encounter: Payer: Self-pay | Admitting: Family Medicine

## 2018-06-19 VITALS — HR 60 | Temp 98.2°F | Ht 70.0 in | Wt 151.0 lb

## 2018-06-19 DIAGNOSIS — F411 Generalized anxiety disorder: Secondary | ICD-10-CM | POA: Diagnosis not present

## 2018-06-19 NOTE — Progress Notes (Signed)
Virtual Visit via video note  I connected with Emily Herring on 06/19/18 at  1:20 PM EDT by video visit and verified that I am speaking with the correct person using two identifiers.   I discussed the limitations, risks, security and privacy concerns of performing an evaluation and management service by telephone and the availability of in person appointments. I also discussed with the patient that there may be a patient responsible charge related to this service. The patient expressed understanding and agreed to proceed.  I was in the office and patient was in Emily Herring home for the virtual visit.   Subjective:    CC: Follow-up mood.  HPI: Anxiety-when I last saw Emily Herring approximately 4 weeks ago she was complaining of increased stress and anxiety.  We decided to start Emily Herring on a low-dose of sertraline.  She started with a half a tab and unfortunately experienced some increased shakiness and anxiety as well as some diarrhea.  She actually called the office a couple of days later and discussed Emily Herring symptoms.  I encouraged Emily Herring to stick with a half a tab a little bit longer.  She says with an additional 3 days on the medication Emily Herring symptoms started to resolve.  About a week later she did go up to a whole tab and has been on that for at least 3 weeks at this point.  She says it seems to be working really well.  She feels much more evenly stable does not feel like she is having a lot of ups and downs.  She denies feeling overly stressed or anxious or worried something bad will happen.  I actually given Emily Herring a small quantity of alprazolam to use just in case and she said she never had to take it.  She says initially she was not sleeping well but seems to be sleeping great so far.  She really has not had any significant negative or lasting side effects.   Past medical history, Surgical history, Family history not pertinant except as noted below, Social history, Allergies, and medications have been entered into the  medical record, reviewed, and corrections made.   Review of Systems: No fevers, chills, night sweats, weight loss, chest pain, or shortness of breath.   Objective:    General: Speaking clearly in complete sentences without any shortness of breath.  Alert and oriented x3.  Normal judgment. No apparent acute distress.  Well-groomed.    Impression and Recommendations:    Generalized anxiety disorder-she is actually doing really well so far on sertraline she is taking a whole tab 50 mg and feels very happy on Emily Herring current dosing regimen.  We will continue and plan to follow back up in 3 to 4 months and check back and at that point and make sure that she is doing well.  If at any point she has any concerns she can always reach out to [place].   Time spent 15 minutes with greater than 50% time spent counseling about anxiety.   I discussed the assessment and treatment plan with the patient. The patient was provided an opportunity to ask questions and all were answered. The patient agreed with the plan and demonstrated an understanding of the instructions.   The patient was advised to call back or seek an in-person evaluation if the symptoms worsen or if the condition fails to improve as anticipated.   Beatrice Lecher, MD

## 2018-08-07 ENCOUNTER — Telehealth: Payer: Self-pay

## 2018-08-07 NOTE — Telephone Encounter (Signed)
Patient was notified and she wants to do the half dose and then do a follow up with you to see how the lower dose is working for her at this time.

## 2018-08-07 NOTE — Telephone Encounter (Signed)
Therma called and states the Zoloft may be too strong. She states she is not feeling any emotions. She states people have noticed and states she is not herself. Please advise.

## 2018-08-07 NOTE — Telephone Encounter (Signed)
OK, go ahead and cut tab in half for one week and then can stop. Is she ok with trying something different? Does sh ehav a preference?

## 2018-08-10 ENCOUNTER — Encounter (INDEPENDENT_AMBULATORY_CARE_PROVIDER_SITE_OTHER): Payer: BLUE CROSS/BLUE SHIELD | Admitting: Ophthalmology

## 2018-10-20 ENCOUNTER — Telehealth (INDEPENDENT_AMBULATORY_CARE_PROVIDER_SITE_OTHER): Payer: BLUE CROSS/BLUE SHIELD | Admitting: Family Medicine

## 2018-10-20 ENCOUNTER — Encounter: Payer: Self-pay | Admitting: Family Medicine

## 2018-10-20 VITALS — HR 72 | Temp 97.5°F | Ht 70.0 in | Wt 158.0 lb

## 2018-10-20 DIAGNOSIS — F411 Generalized anxiety disorder: Secondary | ICD-10-CM | POA: Diagnosis not present

## 2018-10-20 NOTE — Progress Notes (Signed)
Virtual Visit via Video Note  I connected with Rogue Jury on 10/20/18 at  1:20 PM EDT by a video enabled telemedicine application and verified that I am speaking with the correct person using two identifiers.   I discussed the limitations of evaluation and management by telemedicine and the availability of in person appointments. The patient expressed understanding and agreed to proceed.      Established Patient Office Visit  Subjective:  Patient ID: Emily Herring, female    DOB: December 16, 1975  Age: 43 y.o. MRN: [id number]  CC:  Chief Complaint  Patient presents with  . mood    HPI Emily Herring presents for F/U Anxiety.  We previously started her on sertraline.  She did not have any significant side effects but felt like she was too flat on it.  Even family members were noticing and commented on it.  She had called and had decreased to half a tab but did notice a big difference so she eventually came off the medication.  Right now she is just using alprazolam for rescue.  In fact she is only used 4 tabs and still has 6 left.  She feels like things are a little bit better place and just wants to hold off on starting any new medications.  Past Medical History:  Diagnosis Date  . H/O seasonal allergies     Past Surgical History:  Procedure Laterality Date  . LASIK Bilateral 05/13/11    Family History  Problem Relation Age of Onset  . Hypertension Father   . Dementia Father   . Alzheimer's disease Maternal Grandmother     Social History   Socioeconomic History  . Marital status: Single    Spouse name: Alcide Clever  . Number of children: 0  . Years of education: Not on file  . Highest education level: Not on file  Occupational History  . Occupation: Freight forwarder for Human resources officer: [employer]  . Financial resource strain: Not on file  . Food insecurity    Worry: Not on file    Inability: Not on file  . Transportation  needs    Medical: Not on file    Non-medical: Not on file  Tobacco Use  . Smoking status: Former Research scientist (life sciences)  . Smokeless tobacco: Former Systems developer  . Tobacco comment: e-cig  Substance and Sexual Activity  . Alcohol use: Yes    Comment: 1-2 per wk  . Drug use: No  . Sexual activity: Not on file  Lifestyle  . Physical activity    Days per week: Not on file    Minutes per session: Not on file  . Stress: Not on file  Relationships  . Social Herbalist on phone: Not on file    Gets together: Not on file    Attends religious service: Not on file    Active member of club or organization: Not on file    Attends meetings of clubs or organizations: Not on file    Relationship status: Not on file  . Intimate partner violence    Fear of current or ex partner: Not on file    Emotionally abused: Not on file    Physically abused: Not on file    Forced sexual activity: Not on file  Other Topics Concern  . Not on file  Social History Narrative   Exercises daily for 20-30 min.  No caffeine on  regular basis.      Outpatient Medications Prior to Visit  Medication Sig Dispense Refill  . ALPRAZolam (XANAX) 0.5 MG tablet Take 0.5-1 tablets (0.25-0.5 mg total) by mouth daily as needed for anxiety. 10 tablet 0  . AMBULATORY NON FORMULARY MEDICATION Place 15 m under the tongue. Medication Name: CBD oil    . Cholecalciferol (VITAMIN D) 125 MCG (5000 UT) CAPS Take 1 capsule by mouth daily.    Marland Kitchen loratadine (CLARITIN) 10 MG tablet Take 10 mg by mouth daily.    . Probiotic Product (PROBIOTIC-10) CAPS Take 2 capsules by mouth daily.    . valACYclovir (VALTREX) 1000 MG tablet Take 1,000 mg by mouth 2 (two) times daily.    Marland Kitchen VITAMIN K PO Take by mouth daily.    . sertraline (ZOLOFT) 50 MG tablet Take 1 tablet (50 mg total) by mouth daily. 90 tablet 1   Facility-Administered Medications Prior to Visit  Medication Dose Route Frequency Provider Last Rate Last Dose  . iopamidol (ISOVUE-300) 61 %  injection 30 mL  30 mL Oral Once PRN Kandra Nicolas, MD        Allergies  Allergen Reactions  . Morphine And Related Itching  . Percocet [Oxycodone-Acetaminophen] Itching and Swelling    ROS Review of Systems    Objective:    Physical Exam  Pulse 72   Temp (!) 97.5 F (36.4 C)   Ht 5' 10"  (1.778 m)   Wt 158 lb (71.7 kg)   LMP 10/05/2018 (Approximate)   BMI 22.67 kg/m  Wt Readings from Last 3 Encounters:  10/20/18 158 lb (71.7 kg)  06/19/18 151 lb (68.5 kg)  05/23/18 152 lb (68.9 kg)     Health Maintenance Due  Topic Date Due  . HIV Screening  07/23/1989    There are no preventive care reminders to display for this patient.  Lab Results  Component Value Date   TSH 0.50 03/25/2016   Lab Results  Component Value Date   WBC 8.2 03/25/2016   HGB 13.7 03/25/2016   HCT 41.7 03/25/2016   MCV 93.5 03/25/2016   PLT 239 03/25/2016   Lab Results  Component Value Date   NA 139 05/22/2018   K 4.1 05/22/2018   CO2 31 05/22/2018   GLUCOSE 162 (H) 05/22/2018   BUN 16 05/22/2018   CREATININE 1.10 05/22/2018   BILITOT 0.6 05/22/2018   ALKPHOS 38 03/25/2016   AST 17 05/22/2018   ALT 14 05/22/2018   PROT 7.2 05/22/2018   ALBUMIN 4.5 03/25/2016   CALCIUM 9.7 05/22/2018   Lab Results  Component Value Date   CHOL 158 05/22/2018   Lab Results  Component Value Date   HDL 71 05/22/2018   Lab Results  Component Value Date   LDLCALC 60 05/22/2018   Lab Results  Component Value Date   TRIG 198 (H) 05/22/2018   Lab Results  Component Value Date   CHOLHDL 2.2 05/22/2018   No results found for: HGBA1C    Assessment & Plan:   Problem List Items Addressed This Visit    None    Visit Diagnoses    GAD (generalized anxiety disorder)    -  Primary     Anxiety-right now she is doing well and wants to hold off on daily prescriptive medication will just use alprazolam as needed infrequently for rescue.  She can certainly call if she feels like we need to  change or adjust anything.  Live here that she is  actually doing well.  She does have an appointment coming up with Dr. Gala Romney next month.  I did remind her to make sure she goes for her mammogram in November and encouraged her to get her flu shot this year.  No orders of the defined types were placed in this encounter.   Follow-up: No follow-ups on file.    I discussed the assessment and treatment plan with the patient. The patient was provided an opportunity to ask questions and all were answered. The patient agreed with the plan and demonstrated an understanding of the instructions.   The patient was advised to call back or seek an in-person evaluation if the symptoms worsen or if the condition fails to improve as anticipated.  Beatrice Lecher, MD

## 2018-10-20 NOTE — Progress Notes (Signed)
Pt reports that she stopped taking the Sertraline due to it made her feel flat like she had no emotions.  Only taking the xanax as needed and she hasn't really needed it.  Maryruth Eve, Lahoma Crocker, CMA

## 2018-11-27 ENCOUNTER — Ambulatory Visit: Payer: BLUE CROSS/BLUE SHIELD | Admitting: Obstetrics & Gynecology

## 2018-11-27 ENCOUNTER — Other Ambulatory Visit: Payer: Self-pay

## 2018-11-27 ENCOUNTER — Encounter: Payer: Self-pay | Admitting: Obstetrics & Gynecology

## 2018-11-27 VITALS — BP 111/69 | HR 64 | Ht 70.0 in | Wt 159.0 lb

## 2018-11-27 DIAGNOSIS — Z01419 Encounter for gynecological examination (general) (routine) without abnormal findings: Secondary | ICD-10-CM

## 2018-11-27 NOTE — Progress Notes (Signed)
Subjective:     Emily Herring is a 43 y.o. female here for a routine exam.  Current complaints: RLQ pain.  No change in bowel or bladder habits.  Pt is very active and thin.  The pain has been present for several months.  Pt has a monthly menses that is light each month (s/p ablation)  No spotting nor skipping menses.     Gynecologic History Patient's last menstrual period was 11/01/2018. Contraception: abstinence; history of infertility and an ablation Last Pap: in [place] in 2018 and nml (care everywhere) Last mammogram: 2019. Results were: normal  Obstetric History OB History  Gravida Para Term Preterm AB Living  0 0 0 0 0 0  SAB TAB Ectopic Multiple Live Births  0 0 0 0 0     The following portions of the patient's history were reviewed and updated as appropriate: allergies, current medications, past family history, past medical history, past social history, past surgical history and problem list.  Review of Systems Pertinent items noted in HPI and remainder of comprehensive ROS otherwise negative.    Objective:      Vitals:   11/27/18 0913  BP: 111/69  Pulse: 64  Weight: 159 lb (72.1 kg)  Height: 5' 10"  (1.778 m)   Vitals:  WNL General appearance: alert, cooperative and no distress  HEENT: Normocephalic, without obvious abnormality, atraumatic Eyes: negative Throat: lips, mucosa, and tongue normal; teeth and gums normal  Respiratory: Clear to auscultation bilaterally  CV: Regular rate and rhythm  Breasts:  Normal appearance, no masses or tenderness, no nipple retraction or dimpling  GI: Soft, non-tender; bowel sounds normal; no masses,  no organomegaly  GU: External Genitalia:  Tanner V, no lesion Urethra:  No prolapse   Vagina: Pink, normal rugae, no blood or discharge  Cervix: No CMT, no lesion  Uterus:  Normal size and contour, mild tenderness with deep palaption  Adnexa: Right side is larger than left side, mild tenderness on bimanual.  Musculoskeletal:  No edema, redness or tenderness in the calves or thighs  Skin: No lesions or rash; numerous nevi and evidence of sun exposure.  Lymphatic: Axillary adenopathy: none     Psychiatric: Normal mood and behavior        Assessment:    Healthy female exam.    Plan:    Mammogram Fasting BMP and TG (she was not fasting at the time of her labs in March) TVUS to evaluate RLQ pain. Pap due in 2021

## 2018-11-27 NOTE — Progress Notes (Signed)
Last pap- May 2018- negative Last mammogram- 01/17/18- normal Pt declined flu shot

## 2018-12-07 ENCOUNTER — Ambulatory Visit (INDEPENDENT_AMBULATORY_CARE_PROVIDER_SITE_OTHER): Payer: BLUE CROSS/BLUE SHIELD

## 2018-12-07 ENCOUNTER — Other Ambulatory Visit: Payer: Self-pay

## 2018-12-07 DIAGNOSIS — Z01419 Encounter for gynecological examination (general) (routine) without abnormal findings: Secondary | ICD-10-CM

## 2018-12-07 DIAGNOSIS — R102 Pelvic and perineal pain: Secondary | ICD-10-CM | POA: Diagnosis not present

## 2018-12-15 ENCOUNTER — Other Ambulatory Visit: Payer: Self-pay

## 2018-12-15 ENCOUNTER — Emergency Department
Admission: EM | Admit: 2018-12-15 | Discharge: 2018-12-15 | Disposition: A | Discharge status: Home / Self Care | Payer: BLUE CROSS/BLUE SHIELD | Source: Home / Self Care

## 2018-12-15 DIAGNOSIS — M5441 Lumbago with sciatica, right side: Secondary | ICD-10-CM

## 2018-12-15 DIAGNOSIS — H9201 Otalgia, right ear: Secondary | ICD-10-CM | POA: Diagnosis not present

## 2018-12-15 DIAGNOSIS — Z01419 Encounter for gynecological examination (general) (routine) without abnormal findings: Secondary | ICD-10-CM

## 2018-12-15 DIAGNOSIS — H6982 Other specified disorders of Eustachian tube, left ear: Secondary | ICD-10-CM | POA: Diagnosis not present

## 2018-12-15 MED ORDER — CYCLOBENZAPRINE HCL 5 MG PO TABS: 5.0000 mg | ORAL_TABLET | Freq: Two times a day (BID) | ORAL | 0 refills | Status: DC | PRN

## 2018-12-15 MED ORDER — PREDNISONE 20 MG PO TABS: ORAL_TABLET | ORAL | 0 refills | Status: DC

## 2018-12-15 NOTE — ED Provider Notes (Signed)
[facility]    CSN: [id number] Arrival date & time: 12/15/18  0905      History   Chief Complaint Chief Complaint  Patient presents with  . Ear Problem    HPI Emily Herring is a 43 y.Herring. female.   HPI  Emily Herring is a 43 y.Herring. female presenting to UC with c/Herring Right ear pain for about 5 days. Pain started after she went diving for the first time. She forgot to equalize the pressure in her ears before descending past 15 feet.  Pain is aching and a "swishing" sound at times.  She has used Afrin and her claritin with minimal relief.  Denies HA or dizziness.  Denies fever, chills, n/v/d. No drainage or bleeding from her ears.  She is also c/Herring mild intermittent Right side low back pain that occasionally radiates into her Right leg over the last 1 month. She has taken ibuprofen with moderate temporary relief. No specific known injury but reports going climbing this past week, which exacerbated her pain.  Pain is worse when in hip flexion.     Past Medical History:  Diagnosis Date  . H/Herring seasonal allergies   . Infertility, female     Patient Active Problem List   Diagnosis Date Noted  . 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 Idiopathic scoliosis 05/15/2014  . Midline back pain 05/15/2014  . Abnormality of gait 05/15/2014  . Anovulation 01/02/2014  . 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 Spinal stenosis of lumbar region 10/11/2013  . HERPES LABIALIS 09/18/2010  . DEPRESSION/ANXIETY 06/06/2008    Past Surgical History:  Procedure Laterality Date  . ENDOMETRIAL ABLATION    . LASIK Bilateral 05/13/11    OB History    Gravida  0   Para  0   Term  0   Preterm  0   AB  0   Living  0     SAB  0   TAB  0   Ectopic  0   Multiple  0   Live Births  0            Home Medications    Prior to Admission medications   Medication Sig Start Date End Date Taking? Authorizing Provider  ibuprofen (ADVIL) 200 MG tablet Take 200 mg by mouth every 6 (six)  hours as needed.   Yes [provider]  ALPRAZolam Duanne Moron) 0.5 MG tablet Take 0.5-1 tablets (0.25-0.5 mg total) by mouth daily as needed for anxiety. 05/23/18   Emily Marry, MD  AMBULATORY NON FORMULARY MEDICATION Place 15 m under the tongue. Medication Name: CBD oil    [provider]  Cholecalciferol (VITAMIN D) 125 MCG (5000 UT) CAPS Take 1 capsule by mouth daily.    [provider]  cyclobenzaprine (FLEXERIL) 5 MG tablet Take 1-2 tablets (5-10 mg total) by mouth 2 (two) times daily as needed for muscle spasms. 12/15/18   Emily Gens, PA-C  loratadine (CLARITIN) 10 MG tablet Take 10 mg by mouth daily.    [provider]  predniSONE (DELTASONE) 20 MG tablet 3 tabs po daily x 3 days, then 2 tabs x 3 days, then 1.5 tabs x 3 days, then 1 tab x 3 days, then 0.5 tabs x 3 days 12/15/18   Emily Gens, PA-C  Probiotic Product (PROBIOTIC-10) CAPS Take 2 capsules by mouth daily.    [provider]  valACYclovir (VALTREX) 1000 MG tablet Take 1,000 mg by mouth 2 (two) times daily.  [provider]  VITAMIN K PO Take by mouth daily.    [provider]    Family History Family History  Problem Relation Age of Onset  . Hypertension Father   . Dementia Father   . Alzheimer's disease Maternal Grandmother     Social History Social History   Tobacco Use  . Smoking status: Former Research scientist (life sciences)  . Smokeless tobacco: Former Systems developer  . Tobacco comment: e-cig  Substance Use Topics  . Alcohol use: Yes    Comment: 1-2 per wk  . Drug use: No     Allergies   Morphine and related and Percocet [oxycodone-acetaminophen]   Review of Systems Review of Systems  Constitutional: Negative for chills and fever.  HENT: Positive for ear pain. Negative for congestion and ear discharge.   Musculoskeletal: Positive for back pain and myalgias. Negative for arthralgias and gait problem.  Skin: Negative for rash.  Neurological: Negative for  dizziness, weakness, light-headedness, numbness and headaches.     Physical Exam Triage Vital Signs ED Triage Vitals  Enc Vitals Group     BP 12/15/18 0921 122/79     Pulse Rate 12/15/18 0921 62     Resp --      Temp 12/15/18 0921 (!) 97.2 F (36.2 C)     Temp Source 12/15/18 0921 Oral     SpO2 12/15/18 0921 97 %     Weight 12/15/18 0923 159 lb (72.1 kg)     Height 12/15/18 0923 5' 10"  (1.778 m)     Head Circumference --      Peak Flow --      Pain Score 12/15/18 0922 8     Pain Loc --      Pain Edu? --      Excl. in [place]? --    No data found.  Updated Vital Signs BP 122/79 (BP Location: Right Arm)   Pulse 62   Temp (!) 97.2 F (36.2 C) (Oral)   Ht 5' 10"  (1.778 m)   Wt 159 lb (72.1 kg)   SpO2 97%   BMI 22.81 kg/m   Visual Acuity Right Eye Distance:   Left Eye Distance:   Bilateral Distance:    Right Eye Near:   Left Eye Near:    Bilateral Near:     Physical Exam Vitals signs and nursing note reviewed.  Constitutional:      Appearance: Normal appearance. She is well-developed.  HENT:     Head: Normocephalic and atraumatic.     Right Ear: No drainage, swelling or tenderness. A middle ear effusion is present. Tympanic membrane is injected. Tympanic membrane is not perforated, erythematous, retracted or bulging.     Left Ear: No drainage, swelling or tenderness. A middle ear effusion is present. Tympanic membrane is injected. Tympanic membrane is not perforated, erythematous, retracted or bulging.     Nose: Nose normal.     Right Sinus: No maxillary sinus tenderness or frontal sinus tenderness.     Left Sinus: No maxillary sinus tenderness or frontal sinus tenderness.  Neck:     Musculoskeletal: Normal range of motion.  Cardiovascular:     Rate and Rhythm: Normal rate and regular rhythm.  Pulmonary:     Effort: Pulmonary effort is normal. No respiratory distress.     Breath sounds: Normal breath sounds.  Musculoskeletal: Normal range of motion.         General: Tenderness present.     Comments: No spinal tenderness. Mild tenderness to Right lower  lumbar muscles. Increased pain with hip flexion, especially on Right side.  Skin:    General: Skin is warm and dry.     Findings: No bruising or erythema.  Neurological:     Mental Status: She is alert and oriented to person, place, and time.  Psychiatric:        Behavior: Behavior normal.      UC Treatments / Results  Labs (all labs ordered are listed, but only abnormal results are displayed) Labs Reviewed - No data to display  EKG   Radiology No results found.  Procedures Procedures (including critical care time)  Medications Ordered in UC Medications - No data to display  Initial Impression / Assessment and Plan / UC Course  I have reviewed the triage vital signs and the nursing notes.  Pertinent labs & imaging results that were available during my care of the patient were reviewed by me and considered in my medical decision making (see chart for details).     Tympanometry: low peak pressure in LEFT ear. Right ear- normal tympanometry.   Back pain c/w muscle strain. No red flag symptoms.  Encouraged conservative tx. Discussed limiting use of Afrin to help prevent worsening of symptoms. F/u with PCP as needed.  Final Clinical Impressions(s) / UC Diagnoses   Final diagnoses:  Right ear pain  Eustachian tube dysfunction, left  Acute right-sided low back pain with right-sided sciatica     Discharge Instructions      You may take 575m acetaminophen every 4-6 hours or in combination with ibuprofen 400-6064mevery 6-8 hours as needed for pain and inflammation.  Be sure to well hydrated with clear liquids and get at least 8 hours of sleep at night, preferably more while sick.   Please follow up with family medicine in 1 week if needed.  Flexeril (cyclobenzaprine) is a muscle relaxer and may cause drowsiness. Do not drink alcohol, drive, or operate heavy machinery  while taking.      ED Prescriptions    Medication Sig Dispense Auth. Provider   predniSONE (DELTASONE) 20 MG tablet 3 tabs po daily x 3 days, then 2 tabs x 3 days, then 1.5 tabs x 3 days, then 1 tab x 3 days, then 0.5 tabs x 3 days 27 tablet Emily Barnett O, PA-C   cyclobenzaprine (FLEXERIL) 5 MG tablet Take 1-2 tablets (5-10 mg total) by mouth 2 (two) times daily as needed for muscle spasms. 30 tablet [place]e [place]   I have reviewed the PDMP during this encounter.   PhNoe GensPA-C 12/15/18 1200

## 2018-12-15 NOTE — Discharge Instructions (Signed)
°  You may take 576m acetaminophen every 4-6 hours or in combination with ibuprofen 400-6048mevery 6-8 hours as needed for pain and inflammation.  Be sure to well hydrated with clear liquids and get at least 8 hours of sleep at night, preferably more while sick.   Please follow up with family medicine in 1 week if needed.  Flexeril (cyclobenzaprine) is a muscle relaxer and may cause drowsiness. Do not drink alcohol, drive, or operate heavy machinery while taking.

## 2018-12-15 NOTE — ED Triage Notes (Signed)
RT ear pain, went scuba diving 5 days ago did not equalize the pressure.  Low back pain x 1 month radiates into RT leg

## 2018-12-16 LAB — BASIC METABOLIC PANEL
BUN: 16 mg/dL (ref 7–25)
CO2: 28 mmol/L (ref 20–32)
Calcium: 9.4 mg/dL (ref 8.6–10.2)
Chloride: 104 mmol/L (ref 98–110)
Creat: 0.88 mg/dL (ref 0.50–1.10)
Glucose, Bld: 84 mg/dL (ref 65–99)
Potassium: 4.5 mmol/L (ref 3.5–5.3)
Sodium: 139 mmol/L (ref 135–146)

## 2018-12-16 LAB — TRIGLYCERIDES: Triglycerides: 56 mg/dL (ref <150)

## 2019-01-31 ENCOUNTER — Ambulatory Visit: Payer: BLUE CROSS/BLUE SHIELD

## 2019-02-28 ENCOUNTER — Other Ambulatory Visit: Payer: Self-pay

## 2019-02-28 ENCOUNTER — Ambulatory Visit (INDEPENDENT_AMBULATORY_CARE_PROVIDER_SITE_OTHER): Payer: BLUE CROSS/BLUE SHIELD

## 2019-02-28 DIAGNOSIS — Z01419 Encounter for gynecological examination (general) (routine) without abnormal findings: Secondary | ICD-10-CM

## 2019-03-06 ENCOUNTER — Encounter: Payer: Self-pay | Admitting: Family Medicine

## 2019-06-14 ENCOUNTER — Telehealth (INDEPENDENT_AMBULATORY_CARE_PROVIDER_SITE_OTHER): Payer: 59 | Admitting: Family Medicine

## 2019-06-14 ENCOUNTER — Encounter: Payer: Self-pay | Admitting: Family Medicine

## 2019-06-14 VITALS — BP 121/73 | HR 58 | Temp 99.5°F | Wt 154.0 lb

## 2019-06-14 DIAGNOSIS — F411 Generalized anxiety disorder: Secondary | ICD-10-CM | POA: Diagnosis not present

## 2019-06-14 DIAGNOSIS — R519 Headache, unspecified: Secondary | ICD-10-CM | POA: Diagnosis not present

## 2019-06-14 MED ORDER — PREDNISONE 10 MG PO TABS: ORAL_TABLET | ORAL | 0 refills | Status: AC

## 2019-06-14 MED ORDER — ALPRAZOLAM 0.5 MG PO TABS: 0.2500 mg | ORAL_TABLET | Freq: Every day | ORAL | 0 refills | Status: DC | PRN

## 2019-06-14 MED ORDER — AMITRIPTYLINE HCL 25 MG PO TABS: 25.0000 mg | ORAL_TABLET | Freq: Every day | ORAL | 1 refills | Status: DC

## 2019-06-14 NOTE — Assessment & Plan Note (Signed)
Discussed treatment options.  She is to stop all over-the-counter pain relievers including Tylenol ibuprofen Aleve etc.  In the meantime we will put her on a prednisone taper did recommend that she take an over-the-counter PPI which she said she already had at home so she is already getting some GI upset with over-the-counter NSAIDs.  She will need this for the prednisone recommend that she actually go ahead and take it for about 2 weeks until her stomach feels better.  Also go ahead and start 25 mg amitriptyline at bedtime did warn about potential for sedation and increased appetite.  Hopefully headaches will improve over the next 2 to 4 weeks.  If they are not improving encouraged her to reach back out or if she develops any new or unusual neurologic symptoms to reach back out as well.

## 2019-06-14 NOTE — Progress Notes (Addendum)
Virtual Visit via Video Note  I connected with Rogue Jury on 06/14/19 at  3:40 PM EDT by a video enabled telemedicine application and verified that I am speaking with the correct person using two identifiers.   I discussed the limitations of evaluation and management by telemedicine and the availability of in person appointments. The patient expressed understanding and agreed to proceed.    Established Patient Office Visit  Subjective:  Patient ID: Emily Herring, female    DOB: 10-30-1975  Age: 44 y.o. MRN: [id number]  CC: No chief complaint on file.   HPI Janaki Exley presents for frontal headaches after having had Covid back in February.  She says the symptoms of Covid including loss of taste and smell were very short-lived but since then she has had persistent frontal headaches that occur almost daily.  She describes it as a stabbing pain it can last for hours.  She does not usually get nauseated with it but she does have some sound sensitivity.  She says she has been taking enough Tylenol and ibuprofen and now her stomach starting to hurt.  No other neurologic symptoms or vision changes.  No fevers or chills.  She would also like a refill on her Xanax.  Last filled a year ago.  Past Medical History:  Diagnosis Date  . H/O seasonal allergies   . Infertility, female     Past Surgical History:  Procedure Laterality Date  . ENDOMETRIAL ABLATION    . LASIK Bilateral 05/13/11    Family History  Problem Relation Age of Onset  . Hypertension Father   . Dementia Father   . Alzheimer's disease Maternal Grandmother     Social History   Socioeconomic History  . Marital status: Single    Spouse name: Alcide Clever  . Number of children: 0  . Years of education: Not on file  . Highest education level: Not on file  Occupational History  . 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 Smoking status: Former Research scientist (life sciences)   . Smokeless tobacco: Former Systems developer  . Tobacco comment: e-cig  Substance and Sexual Activity  . Alcohol use: Yes    Comment: 1-2 per wk  . Drug use: No  . Sexual activity: Not Currently    Comment: Pt in same sex relationship  Other Topics Concern  . Not on file  Social History Narrative   Exercises daily for 20-30 min.  No caffeine on regular basis.     Social Determinants of Health   Financial Resource Strain:   . Difficulty of Paying Living Expenses:   Food Insecurity:   . Worried About Charity fundraiser in the Last Year:   . Arboriculturist in the Last Year:   Transportation Needs:   . Film/video editor (Medical):   Marland Kitchen Lack of Transportation (Non-Medical):   Physical Activity:   . Days of Exercise per Week:   . Minutes of Exercise per Session:   Stress:   . Feeling of Stress :   Social Connections:   . Frequency of Communication with Friends and Family:   . Frequency of Social Gatherings with Friends and Family:   . Attends Religious Services:   . Active Member of Clubs or Organizations:   . Attends Archivist Meetings:   Marland Kitchen Marital Status:   Intimate Partner Violence:   . Fear of Current or Ex-Partner:   .  Emotionally Abused:   Marland Kitchen Physically Abused:   . Sexually Abused:     Outpatient Medications Prior to Visit  Medication Sig Dispense Refill  . AMBULATORY NON FORMULARY MEDICATION Place 15 m under the tongue. Medication Name: CBD oil    . Cholecalciferol (VITAMIN D) 125 MCG (5000 UT) CAPS Take 1 capsule by mouth daily.    . cyclobenzaprine (FLEXERIL) 5 MG tablet Take 1-2 tablets (5-10 mg total) by mouth 2 (two) times daily as needed for muscle spasms. 30 tablet 0  . ibuprofen (ADVIL) 200 MG tablet Take 200 mg by mouth every 6 (six) hours as needed.    . loratadine (CLARITIN) 10 MG tablet Take 10 mg by mouth daily.    . Probiotic Product (PROBIOTIC-10) CAPS Take 2 capsules by mouth daily.    . valACYclovir (VALTREX) 1000 MG tablet Take 1,000 mg by  mouth 2 (two) times daily.    Marland Kitchen VITAMIN K PO Take by mouth daily.    Marland Kitchen ALPRAZolam (XANAX) 0.5 MG tablet Take 0.5-1 tablets (0.25-0.5 mg total) by mouth daily as needed for anxiety. 10 tablet 0  . predniSONE (DELTASONE) 20 MG tablet 3 tabs po daily x 3 days, then 2 tabs x 3 days, then 1.5 tabs x 3 days, then 1 tab x 3 days, then 0.5 tabs x 3 days 27 tablet 0   Facility-Administered Medications Prior to Visit  Medication Dose Route Frequency Provider Last Rate Last Admin  . iopamidol (ISOVUE-300) 61 % injection 30 mL  30 mL Oral Once PRN Kandra Nicolas, MD        Allergies  Allergen Reactions  . Morphine And Related Itching  . Percocet [Oxycodone-Acetaminophen] Itching and Swelling    ROS Review of Systems    Objective:    Physical Exam  Constitutional: She is oriented to person, place, and time. She appears well-developed and well-nourished.  HENT:  Head: Normocephalic and atraumatic.  Eyes: Conjunctivae and EOM are normal.  Cardiovascular: Normal rate.  Neurological: She is alert and oriented to person, place, and time.  Skin: Skin is dry. No pallor.  Psychiatric: She has a normal mood and affect. Her behavior is normal.  Vitals reviewed.   BP 121/73   Pulse (!) 58   Temp 99.5 F (37.5 C)   Wt 154 lb (69.9 kg)   BMI 22.10 kg/m  Wt Readings from Last 3 Encounters:  06/14/19 154 lb (69.9 kg)  12/15/18 159 lb (72.1 kg)  11/27/18 159 lb (72.1 kg)     Health Maintenance Due  Topic Date Due  . HIV Screening  Never done  . PAP SMEAR-Modifier  06/30/2019    There are no preventive care reminders to display for this patient.  Lab Results  Component Value Date   TSH 0.50 03/25/2016   Lab Results  Component Value Date   WBC 8.2 03/25/2016   HGB 13.7 03/25/2016   HCT 41.7 03/25/2016   MCV 93.5 03/25/2016   PLT 239 03/25/2016   Lab Results  Component Value Date   NA 139 12/15/2018   K 4.5 12/15/2018   CO2 28 12/15/2018   GLUCOSE 84 12/15/2018   BUN 16  12/15/2018   CREATININE 0.88 12/15/2018   BILITOT 0.6 05/22/2018   ALKPHOS 38 03/25/2016   AST 17 05/22/2018   ALT 14 05/22/2018   PROT 7.2 05/22/2018   ALBUMIN 4.5 03/25/2016   CALCIUM 9.4 12/15/2018   Lab Results  Component Value Date   CHOL 158 05/22/2018  Lab Results  Component Value Date   HDL 71 05/22/2018   Lab Results  Component Value Date   LDLCALC 60 05/22/2018   Lab Results  Component Value Date   TRIG 56 12/15/2018   Lab Results  Component Value Date   CHOLHDL 2.2 05/22/2018   No results found for: HGBA1C    Assessment & Plan:   Problem List Items Addressed This Visit      Other   GAD (generalized anxiety disorder) - Primary    Refilled Xanax today.  She only uses 10 tabs per year she really does a great job and using it sparingly.      Relevant Medications   ALPRAZolam (XANAX) 0.5 MG tablet   amitriptyline (ELAVIL) 25 MG tablet   Chronic daily headache    Discussed treatment options.  She is to stop all over-the-counter pain relievers including Tylenol ibuprofen Aleve etc.  In the meantime we will put her on a prednisone taper did recommend that she take an over-the-counter PPI which she said she already had at home so she is already getting some GI upset with over-the-counter NSAIDs.  She will need this for the prednisone recommend that she actually go ahead and take it for about 2 weeks until her stomach feels better.  Also go ahead and start 25 mg amitriptyline at bedtime did warn about potential for sedation and increased appetite.  Hopefully headaches will improve over the next 2 to 4 weeks.  If they are not improving encouraged her to reach back out or if she develops any new or unusual neurologic symptoms to reach back out as well.      Relevant Medications   predniSONE (DELTASONE) 10 MG tablet   amitriptyline (ELAVIL) 25 MG tablet      Meds ordered this encounter  Medications  . ALPRAZolam (XANAX) 0.5 MG tablet    Sig: Take 0.5-1 tablets  (0.25-0.5 mg total) by mouth daily as needed for anxiety.    Dispense:  10 tablet    Refill:  0  . predniSONE (DELTASONE) 10 MG tablet    Sig: Take 8 tablets (80 mg total) by mouth daily with breakfast for 1 day, THEN 6 tablets (60 mg total) daily with breakfast for 1 day, THEN 4 tablets (40 mg total) daily with breakfast for 1 day, THEN 2 tablets (20 mg total) daily with breakfast for 1 day, THEN 1 tablet (10 mg total) daily with breakfast for 1 day.    Dispense:  21 tablet    Refill:  0  . amitriptyline (ELAVIL) 25 MG tablet    Sig: Take 1 tablet (25 mg total) by mouth at bedtime.    Dispense:  30 tablet    Refill:  1    Follow-up: Return if symptoms worsen or fail to improve.     Time spent in encounter 25 minutes  I discussed the assessment and treatment plan with the patient. The patient was provided an opportunity to ask questions and all were answered. The patient agreed with the plan and demonstrated an understanding of the instructions.   The patient was advised to call back or seek an in-person evaluation if the symptoms worsen or if the condition fails to improve as anticipated.   Beatrice Lecher, MD

## 2019-06-14 NOTE — Assessment & Plan Note (Signed)
Refilled Xanax today.  She only uses 10 tabs per year she really does a great job and using it sparingly.

## 2019-06-20 ENCOUNTER — Telehealth: Payer: Self-pay

## 2019-06-20 MED ORDER — TOPIRAMATE 25 MG PO TABS: 25.0000 mg | ORAL_TABLET | Freq: Two times a day (BID) | ORAL | 1 refills | Status: DC

## 2019-06-20 NOTE — Telephone Encounter (Signed)
OK please stop the amitriptyline. We can try topamax instead.

## 2019-06-20 NOTE — Telephone Encounter (Signed)
Glenn called and left a message stating the amitriptyline is working great. However, it is causing her to lose sleep due to nightmares. She would like a different medication.

## 2019-06-21 NOTE — Telephone Encounter (Signed)
Left message advising of recommendations.

## 2019-07-02 NOTE — Telephone Encounter (Signed)
Emily Herring did start the Topamax and stop the Amitriptyline. She states the Topamax made her feel "loopy" in the morning. She stopped all medications. I advised her to call back and schedule a follow up appointment if migraines persisted.

## 2019-07-02 NOTE — Telephone Encounter (Signed)
TriedOkay, aching the Topamax just at bedtime for a week before trying to go up to twice a day.  It may be that twice a day was just a little too much to start off the back.  So okay to just start with once a day at bedtime if she would like and see how that goes in the may be build up to twice a day

## 2019-07-03 NOTE — Telephone Encounter (Signed)
Patient advised of recommendations.  

## 2019-07-12 ENCOUNTER — Other Ambulatory Visit: Payer: Self-pay

## 2019-07-12 ENCOUNTER — Encounter: Payer: Self-pay | Admitting: Family Medicine

## 2019-07-12 ENCOUNTER — Ambulatory Visit (INDEPENDENT_AMBULATORY_CARE_PROVIDER_SITE_OTHER): Payer: 59 | Admitting: Family Medicine

## 2019-07-12 ENCOUNTER — Telehealth: Payer: Self-pay | Admitting: Family Medicine

## 2019-07-12 VITALS — BP 128/64 | HR 54 | Ht 70.0 in | Wt 156.0 lb

## 2019-07-12 DIAGNOSIS — Z7184 Encounter for health counseling related to travel: Secondary | ICD-10-CM

## 2019-07-12 DIAGNOSIS — S161XXA Strain of muscle, fascia and tendon at neck level, initial encounter: Secondary | ICD-10-CM | POA: Diagnosis not present

## 2019-07-12 DIAGNOSIS — R519 Headache, unspecified: Secondary | ICD-10-CM

## 2019-07-12 MED ORDER — CYCLOBENZAPRINE HCL 5 MG PO TABS: 5.0000 mg | ORAL_TABLET | Freq: Two times a day (BID) | ORAL | 0 refills | Status: DC | PRN

## 2019-07-12 NOTE — Telephone Encounter (Signed)
mychart sent.

## 2019-07-12 NOTE — Progress Notes (Signed)
Established Patient Office Visit  Subjective:  Patient ID: Emily Herring, female    DOB: 04-27-1975  Age: 44 y.o. MRN: [id number]  CC:  Chief Complaint  Patient presents with  . Headache    not as bad in the mornings.her sense of smell becomes heightented right before she gets a headache  . Neck Pain    R sided on/off    HPI Emily Herring presents for headaches.  She was taking amitriptyline and it was working well but unfortunately was causing nightmares so we discontinued it and had her try Topamax.  She says it just made her fairly really "loopy" to the point she could even fill out her Mother's Day card.  She tried decreasing it and still felt very loopy on it so discontinued it.  She says last week she was having some pretty severe headaches but this week it is actually been a little bit better she is actually had a couple days where she did not have headache and did not have to take anything but on Monday and Tuesday she did use some Aleve.  She is also been taking her sinus/allergy medications including a pill and Flonase.  Also complains of some intermittent right-sided neck pain a couple time she is woken up with it just feeling like it is in spasm.  She has been using some TENS unit.  She also developed a advised to do laser therapy on it and is scheduled for massage.  She is just been trying to rub the knots in the tissue.  She did take Flexeril and says that did seem to help.  Past Medical History:  Diagnosis Date  . H/O seasonal allergies   . Infertility, female     Past Surgical History:  Procedure Laterality Date  . ENDOMETRIAL ABLATION    . LASIK Bilateral 05/13/11    Family History  Problem Relation Age of Onset  . Hypertension Father   . Dementia Father   . Alzheimer's disease Maternal Grandmother     Social History   Socioeconomic History  . Marital status: Single    Spouse name: Emily Herring  . Number of children: 0  . Years of education: Not on file  .  Highest education level: Not on file  Occupational History  . 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 Smoking status: Former Research scientist (life sciences)  . Smokeless tobacco: Former Systems developer  . Tobacco comment: e-cig  Substance and Sexual Activity  . Alcohol use: Yes    Comment: 1-2 per wk  . Drug use: No  . Sexual activity: Not Currently    Comment: Pt in same sex relationship  Other Topics Concern  . Not on file  Social History Narrative   Exercises daily for 20-30 min.  No caffeine on regular basis.     Social Determinants of Health   Financial Resource Strain:   . Difficulty of Paying Living Expenses:   Food Insecurity:   . Worried About Charity fundraiser in the Last Year:   . Arboriculturist in the Last Year:   Transportation Needs:   . Film/video editor (Medical):   Marland Kitchen Lack of Transportation (Non-Medical):   Physical Activity:   . Days of Exercise per Week:   . Minutes of Exercise per Session:   Stress:   . Feeling of Stress :   Social Connections:   .  Frequency of Communication with Friends and Family:   . Frequency of Social Gatherings with Friends and Family:   . Attends Religious Services:   . Active Member of Clubs or Organizations:   . Attends Archivist Meetings:   Marland Kitchen Marital Status:   Intimate Partner Violence:   . Fear of Current or Ex-Partner:   . Emotionally Abused:   Marland Kitchen Physically Abused:   . Sexually Abused:     Outpatient Medications Prior to Visit  Medication Sig Dispense Refill  . ALPRAZolam (XANAX) 0.5 MG tablet Take 0.5-1 tablets (0.25-0.5 mg total) by mouth daily as needed for anxiety. 10 tablet 0  . AMBULATORY NON FORMULARY MEDICATION Place 15 m under the tongue. Medication Name: CBD oil    . Cholecalciferol (VITAMIN D) 125 MCG (5000 UT) CAPS Take 1 capsule by mouth daily.    Marland Kitchen loratadine (CLARITIN) 10 MG tablet Take 10 mg by mouth daily.    . Probiotic Product (PROBIOTIC-10)  CAPS Take 2 capsules by mouth daily.    . valACYclovir (VALTREX) 1000 MG tablet Take 1,000 mg by mouth 2 (two) times daily.    Marland Kitchen VITAMIN K PO Take by mouth daily.    . cyclobenzaprine (FLEXERIL) 10 MG tablet Take 5 mg by mouth 3 (three) times daily as needed for muscle spasms.    . cyclobenzaprine (FLEXERIL) 5 MG tablet Take 5-10 mg by mouth 2 (two) times daily as needed for muscle spasms. Take 1-2 tablets (5-10 mg total) by mouth 2 (two) times daily as needed for muscle spasms.    . cyclobenzaprine (FLEXERIL) 5 MG tablet Take 1-2 tablets (5-10 mg total) by mouth 2 (two) times daily as needed for muscle spasms. 30 tablet 0  . ibuprofen (ADVIL) 200 MG tablet Take 200 mg by mouth every 6 (six) hours as needed.    . topiramate (TOPAMAX) 25 MG tablet Take 1 tablet (25 mg total) by mouth 2 (two) times daily. 60 tablet 1   Facility-Administered Medications Prior to Visit  Medication Dose Route Frequency Provider Last Rate Last Admin  . iopamidol (ISOVUE-300) 61 % injection 30 mL  30 mL Oral Once PRN Kandra Nicolas, MD        Allergies  Allergen Reactions  . Morphine And Related Itching  . Percocet [Oxycodone-Acetaminophen] Itching and Swelling  . Amitriptyline Other (See Comments)    Nightmares  . Topiramate Other (See Comments)    "caused her to feel loopy"    ROS Review of Systems    Objective:    Physical Exam  Constitutional: She is oriented to person, place, and time. She appears well-developed and well-nourished.  HENT:  Head: Normocephalic and atraumatic.  Eyes: Conjunctivae and EOM are normal.  Cardiovascular: Normal rate.  Pulmonary/Chest: Effort normal.  Musculoskeletal:     Comments: Cervical flexion and decreased extension.  Decreased rotation right compared to left.  Neurological: She is alert and oriented to person, place, and time.  Skin: Skin is dry. No pallor.  Psychiatric: She has a normal mood and affect. Her behavior is normal.  Vitals reviewed.   BP  128/64   Pulse (!) 54   Ht 5' 10"  (1.778 m)   Wt 156 lb (70.8 kg)   LMP 07/02/2019   SpO2 99%   BMI 22.38 kg/m  Wt Readings from Last 3 Encounters:  07/12/19 156 lb (70.8 kg)  06/14/19 154 lb (69.9 kg)  12/15/18 159 lb (72.1 kg)     There are no preventive care  reminders to display for this patient.  There are no preventive care reminders to display for this patient.  Lab Results  Component Value Date   TSH 0.50 03/25/2016   Lab Results  Component Value Date   WBC 8.2 03/25/2016   HGB 13.7 03/25/2016   HCT 41.7 03/25/2016   MCV 93.5 03/25/2016   PLT 239 03/25/2016   Lab Results  Component Value Date   NA 139 12/15/2018   K 4.5 12/15/2018   CO2 28 12/15/2018   GLUCOSE 84 12/15/2018   BUN 16 12/15/2018   CREATININE 0.88 12/15/2018   BILITOT 0.6 05/22/2018   ALKPHOS 38 03/25/2016   AST 17 05/22/2018   ALT 14 05/22/2018   PROT 7.2 05/22/2018   ALBUMIN 4.5 03/25/2016   CALCIUM 9.4 12/15/2018   Lab Results  Component Value Date   CHOL 158 05/22/2018   Lab Results  Component Value Date   HDL 71 05/22/2018   Lab Results  Component Value Date   LDLCALC 60 05/22/2018   Lab Results  Component Value Date   TRIG 56 12/15/2018   Lab Results  Component Value Date   CHOLHDL 2.2 05/22/2018   No results found for: HGBA1C    Assessment & Plan:   Problem List Items Addressed This Visit      Other   Chronic daily headache - Primary   Relevant Medications   cyclobenzaprine (FLEXERIL) 5 MG tablet    Other Visit Diagnoses    Strain of neck muscle, initial encounter       Travel advice encounter         Chronic daily headache-she actually did have a better week so after much discussion we decided to not add anything for prophylaxis and just monitor over the next week to see if she continues to improve.  The chronic daily headache started after having had Covid.  If she decides she wants to restart medication we could consider nortriptyline to see if it is as  effective as the amitriptyline but without the nightmares.  Or could consider a beta-blocker.  Did warn about overusing Aleve.  Right cervical strain-we will refill her Flexeril.  Continue on stretches.  She has a massage scheduled which is great.  Could consider more formal physical therapy if her pain persists.  Travel advice-she will also be traveling out of the country and wanted to know what she could use for traveler's diarrhea which she gets frequently when she travels.  We discussed a prescription for azithromycin when needed she will just contact me a week or 2 prior to travel.  Also recommended that she take Imodium, Pepto-Bismol etc. with her.  Meds ordered this encounter  Medications  . cyclobenzaprine (FLEXERIL) 5 MG tablet    Sig: Take 1-2 tablets (5-10 mg total) by mouth 2 (two) times daily as needed for muscle spasms. Take 1-2 tablets (5-10 mg total) by mouth 2 (two) times daily as needed for muscle spasms.    Dispense:  30 tablet    Refill:  0    Follow-up: Return if symptoms worsen or fail to improve.    Beatrice Lecher, MD

## 2019-11-09 ENCOUNTER — Encounter (INDEPENDENT_AMBULATORY_CARE_PROVIDER_SITE_OTHER): Payer: BLUE CROSS/BLUE SHIELD | Admitting: Ophthalmology

## 2019-11-09 ENCOUNTER — Other Ambulatory Visit: Payer: Self-pay

## 2019-11-09 DIAGNOSIS — H35412 Lattice degeneration of retina, left eye: Secondary | ICD-10-CM | POA: Diagnosis not present

## 2019-11-09 DIAGNOSIS — H43813 Vitreous degeneration, bilateral: Secondary | ICD-10-CM | POA: Diagnosis not present

## 2019-11-09 DIAGNOSIS — H33303 Unspecified retinal break, bilateral: Secondary | ICD-10-CM | POA: Diagnosis not present

## 2019-11-09 DIAGNOSIS — D352 Benign neoplasm of pituitary gland: Secondary | ICD-10-CM

## 2019-11-09 DIAGNOSIS — H2512 Age-related nuclear cataract, left eye: Secondary | ICD-10-CM

## 2019-11-28 ENCOUNTER — Encounter: Payer: Self-pay | Admitting: Family Medicine

## 2019-11-28 MED ORDER — AZITHROMYCIN 250 MG PO TABS: ORAL_TABLET | ORAL | 0 refills | Status: AC

## 2020-01-01 ENCOUNTER — Encounter: Payer: Self-pay | Admitting: Family Medicine

## 2020-01-04 MED ORDER — CIPROFLOXACIN HCL 250 MG PO TABS: 250.0000 mg | ORAL_TABLET | Freq: Two times a day (BID) | ORAL | 0 refills | Status: DC

## 2020-04-07 ENCOUNTER — Ambulatory Visit (INDEPENDENT_AMBULATORY_CARE_PROVIDER_SITE_OTHER): Payer: 59 | Admitting: Obstetrics & Gynecology

## 2020-04-07 ENCOUNTER — Encounter: Payer: Self-pay | Admitting: Obstetrics & Gynecology

## 2020-04-07 ENCOUNTER — Other Ambulatory Visit (HOSPITAL_COMMUNITY)
Admission: RE | Admit: 2020-04-07 | Discharge: 2020-04-07 | Disposition: A | Discharge status: Home / Self Care | Payer: 59 | Source: Ambulatory Visit | Attending: Obstetrics & Gynecology | Admitting: Obstetrics & Gynecology

## 2020-04-07 ENCOUNTER — Other Ambulatory Visit: Payer: Self-pay

## 2020-04-07 VITALS — BP 125/66 | HR 68 | Resp 16 | Ht 70.0 in | Wt 159.0 lb

## 2020-04-07 DIAGNOSIS — E221 Hyperprolactinemia: Secondary | ICD-10-CM

## 2020-04-07 DIAGNOSIS — Z01419 Encounter for gynecological examination (general) (routine) without abnormal findings: Secondary | ICD-10-CM | POA: Diagnosis not present

## 2020-04-07 NOTE — Patient Instructions (Signed)
Stool DNA Testing for Colon Cancer Why am I having this test? Colon cancer is one of the most common types of cancer and a leading cause of cancer-related death. Testing for colon cancer before symptoms develop (screening) can help to find the cancer early, which leads to a better chance for effective treatment. Stool DNA testing, also called the fecal immunochemical DNA test (FIT-DNA test), is one method of screening for colon cancer. All adults should have colon cancer screening starting at age 63 and continuing until age 75. After age 80, you may no longer need screening based on your health. Your health care provider may also recommend screening before age 27. You will have tests every 1-10 years, depending on your results and the type of screening test. Screening may include having stool DNA testing every 3 years if:  You have no symptoms of colon cancer. Symptoms include rectal bleeding, unexplained weight loss, and changes in bowel habits.  You have an average risk of colon cancer. Average risk means: ? You do not have precancerous polyps. These are abnormal growths that can become cancerous (malignant). ? You do not have family or personal history of either colon cancer or a colon disease that increases your risk for colon cancer. ? You do not have a personal history of other types of cancer. ? You do not have diabetes. This test may be done more often for people at high risk. What is being tested? For the test, a sample of the stool is checked for blood and changes in DNA that could lead to cancer. Growths in the colon bleed and shed cells if they are cancerous or precancerous. Blood and cells can be picked up by stool as it passes through the colon. This test checks for blood cells as well as nine types of DNA (biomarkers) in three genes that have been linked to colon cancer and precancerous polyps. What kind of sample is taken? This test uses a stool sample that is collected when you have  a bowel movement.   How do I collect samples at home? You will be sent a stool sample collection kit in the mail. The kit will include instructions and everything you need to get the sample. When collecting a stool (feces) sample at home, make sure you:  Use supplies and instructions that you received from the lab.  Have a bowel movement directly into a clean, dry container. Do not collect stool from the water in the toilet.  Transfer the sample into the germ-free (sterile) cup that you received from the lab.  Do not let any toilet paper or urine get into the cup.  Write your information on the label of the cup. Use ink that will not smear. To do this: ? Write your full legal name. Do not write a nickname. ? Write your date of birth. ? Write the date and time that you collected the sample.  Return the sample to the lab as told. The sample will be checked within about 3 days of when the lab received it. The results will be sent to your health care provider. How do I prepare for this test? There is no preparation needed for this test. However, do not collect a stool sample if:  You have bleeding hemorrhoids. These are swollen veins in and around the rectum or the opening between the buttocks (anus).  You have any rectal bleeding.  You have a cut on your hand or finger.  You have your menstrual period.  You have diarrhea. How are the results reported? Your test results will be reported as either positive or negative. Sometimes, the test results may report that a condition is present when it is not present (false-positive result). Sometimes, the test results may report that a condition is not present when it is present (false-negative result). What do the results mean?  A positive result means that the test found abnormal DNA or blood cells, or both. If you have a positive result, you will need to have a follow-up exam of your colon done with a scope (colonoscopy).  A negative  result means that no blood or changes in DNA were found. This does not guarantee that you do not have colon cancer. Your health care provider may recommend that you have other screening tests. Talk with your health care provider about what your results mean. Ask if further testing needs to be done and ask about the timing of those tests. Questions to ask your health care provider Ask your health care provider, or the department that is doing the test:  When will my results be ready?  How will I get my results?  What are my treatment options?  What other tests do I need?  What are my next steps? Summary  Stool DNA testing, also called the FIT-DNA test, is one method of screening for colon cancer.  For the test, a sample of the stool (feces) is checked for blood and changes in DNA that could lead to cancer.  Do not collect astool sample if you have bleeding hemorrhoids, rectal bleeding, a cut on your hand or finger, your menstrual period, or diarrhea.  Your test results will be reported as either positive or negative. This information is not intended to replace advice given to you by your health care provider. Make sure you discuss any questions you have with your health care provider. Document Revised: 11/15/2019 Document Reviewed: 06/06/2019 Elsevier Patient Education  2021 [employer].

## 2020-04-07 NOTE — Progress Notes (Signed)
Subjective:     Emily Herring is a 45 y.o. female here for a routine exam.  Current complaints: needs prolactin drawn. Having monthly menses with bleeding two day s(s/p ablation).  She has symptoms of ovulation including breast tenderness each month.  Pt had a little sadness during her last secretory phase.  She would like to monitor.  We discussed PMDD if the symptoms become persistent.    Gynecologic History Patient's last menstrual period was 03/24/2020. Contraception: none (same sex relationship) Last Pap: 2018. Results were: normal Last mammogram: 2020. Results were: normal  Obstetric History OB History  Gravida Para Term Preterm AB Living  0 0 0 0 0 0  SAB IAB Ectopic Multiple Live Births  0 0 0 0 0     The following portions of the patient's history were reviewed and updated as appropriate: allergies, current medications, past family history, past medical history, past social history, past surgical history and problem list.  Review of Systems Pertinent items noted in HPI and remainder of comprehensive ROS otherwise negative.    Objective:      Vitals:   04/07/20 0846  BP: 125/66  Pulse: 68  Resp: 16  Weight: 159 lb (72.1 kg)  Height: 5' 10"  (1.778 m)   Vitals:  WNL General appearance: alert, cooperative and no distress  HEENT: Normocephalic, without obvious abnormality, atraumatic Eyes: negative Throat: lips, mucosa, and tongue normal; teeth and gums normal  Respiratory: Clear to auscultation bilaterally  CV: Regular rate and rhythm  Breasts:  Normal appearance, no masses or tenderness, no nipple retraction or dimpling  GI: Soft, non-tender; bowel sounds normal; no masses,  no organomegaly  GU: External Genitalia:  Tanner V, no lesion Urethra:  No prolapse   Vagina: Pink, normal rugae, no blood or discharge  Cervix: No CMT, no lesion  Uterus:  Normal size and contour, non tender  Adnexa: Normal, no masses, non tender  Musculoskeletal: No edema, redness or  tenderness in the calves or thighs  Skin: No lesions or rash  Lymphatic: Axillary adenopathy: none     Psychiatric: Normal mood and behavior     Assessment:    Healthy female exam.    Plan:   1.  Pap with cotesting 2.  Mammogram 3.  Yearly labs 4.  Prolactin level 5.  Discussed colorectal screening starting at 38.  Pt will discuss with PCP.

## 2020-04-08 LAB — LIPID PANEL
Cholesterol: 163 mg/dL (ref <200)
HDL: 71 mg/dL (ref >=50)
LDL Cholesterol (Calc): 74 mg/dL (calc)
Non-HDL Cholesterol (Calc): 92 mg/dL (calc) (ref <130)
Total CHOL/HDL Ratio: 2.3 (calc) (ref <5.0)
Triglycerides: 93 mg/dL (ref <150)

## 2020-04-08 LAB — COMPREHENSIVE METABOLIC PANEL
AG Ratio: 1.9 (calc) (ref 1.0–2.5)
ALT: 15 U/L (ref 6–29)
AST: 19 U/L (ref 10–35)
Albumin: 4.3 g/dL (ref 3.6–5.1)
Alkaline phosphatase (APISO): 47 U/L (ref 31–125)
BUN: 16 mg/dL (ref 7–25)
CO2: 28 mmol/L (ref 20–32)
Calcium: 9.2 mg/dL (ref 8.6–10.2)
Chloride: 103 mmol/L (ref 98–110)
Creat: 0.89 mg/dL (ref 0.50–1.10)
Globulin: 2.3 g/dL (calc) (ref 1.9–3.7)
Glucose, Bld: 84 mg/dL (ref 65–99)
Potassium: 4.1 mmol/L (ref 3.5–5.3)
Sodium: 139 mmol/L (ref 135–146)
Total Bilirubin: 0.9 mg/dL (ref 0.2–1.2)
Total Protein: 6.6 g/dL (ref 6.1–8.1)

## 2020-04-08 LAB — CYTOLOGY - PAP
Comment: NEGATIVE
Diagnosis: NEGATIVE
High risk HPV: NEGATIVE

## 2020-04-08 LAB — CBC
HCT: 41 % (ref 35.0–45.0)
Hemoglobin: 13.8 g/dL (ref 11.7–15.5)
MCH: 31.9 pg (ref 27.0–33.0)
MCHC: 33.7 g/dL (ref 32.0–36.0)
MCV: 94.9 fL (ref 80.0–100.0)
MPV: 10.5 fL (ref 7.5–12.5)
Platelets: 248 10*3/uL (ref 140–400)
RBC: 4.32 10*6/uL (ref 3.80–5.10)
RDW: 12 % (ref 11.0–15.0)
WBC: 7.2 10*3/uL (ref 3.8–10.8)

## 2020-04-08 LAB — VITAMIN D 25 HYDROXY (VIT D DEFICIENCY, FRACTURES): Vit D, 25-Hydroxy: 61 ng/mL (ref 30–100)

## 2020-04-08 LAB — PROLACTIN: Prolactin: 14.5 ng/mL

## 2020-04-30 ENCOUNTER — Ambulatory Visit: Payer: 59

## 2020-08-04 ENCOUNTER — Other Ambulatory Visit: Payer: Self-pay | Admitting: Obstetrics & Gynecology

## 2020-08-04 DIAGNOSIS — Z1231 Encounter for screening mammogram for malignant neoplasm of breast: Secondary | ICD-10-CM

## 2020-08-07 ENCOUNTER — Ambulatory Visit (INDEPENDENT_AMBULATORY_CARE_PROVIDER_SITE_OTHER): Payer: 59

## 2020-08-07 ENCOUNTER — Other Ambulatory Visit: Payer: Self-pay

## 2020-08-07 DIAGNOSIS — Z1231 Encounter for screening mammogram for malignant neoplasm of breast: Secondary | ICD-10-CM

## 2020-11-10 ENCOUNTER — Other Ambulatory Visit: Payer: Self-pay

## 2020-11-10 ENCOUNTER — Encounter (INDEPENDENT_AMBULATORY_CARE_PROVIDER_SITE_OTHER): Payer: 59 | Admitting: Ophthalmology

## 2020-11-10 DIAGNOSIS — H2513 Age-related nuclear cataract, bilateral: Secondary | ICD-10-CM | POA: Diagnosis not present

## 2020-11-10 DIAGNOSIS — H43813 Vitreous degeneration, bilateral: Secondary | ICD-10-CM | POA: Diagnosis not present

## 2020-11-10 DIAGNOSIS — H33303 Unspecified retinal break, bilateral: Secondary | ICD-10-CM | POA: Diagnosis not present

## 2021-05-06 ENCOUNTER — Telehealth: Payer: Self-pay | Admitting: Family Medicine

## 2021-05-06 NOTE — Telephone Encounter (Signed)
Pt called and said that she is experiencing what she calls hormonal imbalance. Wants to know if she can have some blood work done before her appt to verify if she is going through menopause.  ?

## 2021-05-06 NOTE — Telephone Encounter (Signed)
I would prefer to chat with her about what is going on and that way we can order any labs that we need all at once the day of her appt.  We may need to even look into some other potential causes. ?

## 2021-05-06 NOTE — Telephone Encounter (Signed)
Contacted patient and explained. She said ok and will be here for her appt on 3/24 ?

## 2021-05-22 ENCOUNTER — Ambulatory Visit: Payer: 59 | Admitting: Family Medicine

## 2021-05-22 ENCOUNTER — Other Ambulatory Visit: Payer: Self-pay

## 2021-05-22 ENCOUNTER — Encounter: Payer: Self-pay | Admitting: Family Medicine

## 2021-05-22 VITALS — BP 127/63 | HR 61 | Ht 70.0 in | Wt 160.0 lb

## 2021-05-22 DIAGNOSIS — Z7184 Encounter for health counseling related to travel: Secondary | ICD-10-CM | POA: Diagnosis not present

## 2021-05-22 DIAGNOSIS — F411 Generalized anxiety disorder: Secondary | ICD-10-CM

## 2021-05-22 DIAGNOSIS — M545 Low back pain, unspecified: Secondary | ICD-10-CM

## 2021-05-22 DIAGNOSIS — R69 Illness, unspecified: Secondary | ICD-10-CM | POA: Diagnosis not present

## 2021-05-22 MED ORDER — CYCLOBENZAPRINE HCL 10 MG PO TABS: 10.0000 mg | ORAL_TABLET | Freq: Three times a day (TID) | ORAL | 0 refills | Status: DC | PRN

## 2021-05-22 MED ORDER — AZITHROMYCIN 250 MG PO TABS: ORAL_TABLET | ORAL | 0 refills | Status: AC

## 2021-05-22 MED ORDER — ONDANSETRON HCL 4 MG PO TABS: 4.0000 mg | ORAL_TABLET | Freq: Three times a day (TID) | ORAL | 0 refills | Status: DC | PRN

## 2021-05-22 MED ORDER — ALPRAZOLAM 0.5 MG PO TABS: 0.2500 mg | ORAL_TABLET | Freq: Every day | ORAL | 0 refills | Status: DC | PRN

## 2021-05-22 NOTE — Progress Notes (Signed)
? ?Established Patient Office Visit ? ?Subjective:  ?Patient ID: Emily Herring, female    DOB: 01-26-1976  Age: 46 y.o. MRN: [id number] ? ?CC:  ?Chief Complaint  ?Patient presents with  ? Anxiety  ? ? ?HPI ?Emily Herring presents for f/u GAD -she is doing well overall she says in fact this year has actually been pretty good she feels like overall her anxiety has been pretty well controlled she does need a refill on her alprazolam the last time it was filled was in 2021.  She says she still actually has a couple of tabs left but they are expired. ? ?Still been exercising and working out regularly but she is just been experiencing little bit more joint pain over the last year or so particularly in the knees elbows and lower back.  She has not noticed any heat or swelling in those joints.  In fact she says her back flared up recently in February and she had to take some leftover Flexeril that she had had previously prescribed. ? ?She is also traveling to [place] and would like a prescription for antibiotic as well as something for nausea just in case. ? ?Past Medical History:  ?Diagnosis Date  ? H/O seasonal allergies   ? Infertility, female   ? ? ?Past Surgical History:  ?Procedure Laterality Date  ? ENDOMETRIAL ABLATION    ? LASIK Bilateral 05/13/11  ? ? ?Family History  ?Problem Relation Age of Onset  ? Hypertension Father   ? Dementia Father   ? Diabetes Father   ? Alzheimer's disease Maternal Grandmother   ? ? ?Social History  ? ?Socioeconomic History  ? Marital status: Single  ?  Spouse name: Alcide Clever  ? Number of children: 0  ? Years of education: Not on file  ? Highest education level: Not on file  ?Occupational History  ? Occupation: Freight forwarder for safety and claim[employer]  ?  Employer: [employer]  ?  Comment: [place]  ?Tobacco Use  ? Smoking status: Former  ? Smokeless tobacco: Former  ? Tobacco comments:  ?  e-cig  ?Substance and Sexual Activity  ? Alcohol use: Yes  ?  Comment: 1-2 per wk  ? Drug use: No  ?  Sexual activity: Not Currently  ?  Comment: Pt in same sex relationship  ?Other Topics Concern  ? Not on file  ?Social History Narrative  ? Exercises daily for 20-30 min.  No caffeine on regular basis.    ? ?Social Determinants of Health  ? ?Financial Resource Strain: Not on file  ?Food Insecurity: Not on file  ?Transportation Needs: Not on file  ?Physical Activity: Not on file  ?Stress: Not on file  ?Social Connections: Not on file  ?Intimate Partner Violence: Not on file  ? ? ?Outpatient Medications Prior to Visit  ?Medication Sig Dispense Refill  ? Cholecalciferol (VITAMIN D) 125 MCG (5000 UT) CAPS Take 1 capsule by mouth daily.    ? loratadine (CLARITIN) 10 MG tablet Take 10 mg by mouth daily.    ? Probiotic Product (PROBIOTIC-10) CAPS Take 2 capsules by mouth daily.    ? valACYclovir (VALTREX) 1000 MG tablet Take 1,000 mg by mouth 2 (two) times daily.    ? VITAMIN K PO Take by mouth daily.    ? ALPRAZolam (XANAX) 0.5 MG tablet Take 0.5-1 tablets (0.25-0.5 mg total) by mouth daily as needed for anxiety. 10 tablet 0  ? ?Facility-Administered Medications Prior to Visit  ?Medication Dose Route  Frequency Provider Last Rate Last Admin  ? iopamidol (ISOVUE-300) 61 % injection 30 mL  30 mL Oral Once PRN Kandra Nicolas, MD      ? ? ?Allergies  ?Allergen Reactions  ? Morphine And Related Itching  ? Percocet [Oxycodone-Acetaminophen] Itching and Swelling  ? Amitriptyline Other (See Comments)  ?  Nightmares  ? Topiramate Other (See Comments)  ?  "caused her to feel loopy"  ? ? ?ROS ?Review of Systems ? ?  ?Objective:  ?  ?Physical Exam ?Constitutional:   ?   Appearance: Normal appearance. She is well-developed.  ?HENT:  ?   Head: Normocephalic and atraumatic.  ?Cardiovascular:  ?   Rate and Rhythm: Normal rate and regular rhythm.  ?   Heart sounds: Normal heart sounds.  ?Pulmonary:  ?   Effort: Pulmonary effort is normal.  ?   Breath sounds: Normal breath sounds.  ?Skin: ?   General: Skin is warm and dry.  ?Neurological:   ?   Mental Status: She is alert and oriented to person, place, and time.  ?Psychiatric:     ?   Behavior: Behavior normal.  ? ? ?BP 127/63   Pulse 61   Ht 5' 10"  (1.778 m)   Wt 160 lb (72.6 kg)   LMP 04/29/2021 (Exact Date)   SpO2 97%   BMI 22.96 kg/m?  ?Wt Readings from Last 3 Encounters:  ?05/22/21 160 lb (72.6 kg)  ?04/07/20 159 lb (72.1 kg)  ?07/12/19 156 lb (70.8 kg)  ? ? ? ?There are no preventive care reminders to display for this patient. ? ? ?There are no preventive care reminders to display for this patient. ? ?Lab Results  ?Component Value Date  ? TSH 0.50 03/25/2016  ? ?Lab Results  ?Component Value Date  ? WBC 7.2 04/07/2020  ? HGB 13.8 04/07/2020  ? HCT 41.0 04/07/2020  ? MCV 94.9 04/07/2020  ? PLT 248 04/07/2020  ? ?Lab Results  ?Component Value Date  ? NA 139 04/07/2020  ? K 4.1 04/07/2020  ? CO2 28 04/07/2020  ? GLUCOSE 84 04/07/2020  ? BUN 16 04/07/2020  ? CREATININE 0.89 04/07/2020  ? BILITOT 0.9 04/07/2020  ? ALKPHOS 38 03/25/2016  ? AST 19 04/07/2020  ? ALT 15 04/07/2020  ? PROT 6.6 04/07/2020  ? ALBUMIN 4.5 03/25/2016  ? CALCIUM 9.2 04/07/2020  ? ?Lab Results  ?Component Value Date  ? CHOL 163 04/07/2020  ? ?Lab Results  ?Component Value Date  ? HDL 71 04/07/2020  ? ?Lab Results  ?Component Value Date  ? Seth Ward 74 04/07/2020  ? ?Lab Results  ?Component Value Date  ? TRIG 93 04/07/2020  ? ?Lab Results  ?Component Value Date  ? CHOLHDL 2.3 04/07/2020  ? ?No results found for: HGBA1C ? ?  ?Assessment & Plan:  ? ?Problem List Items Addressed This Visit   ? ?  ? Other  ? Travel advice encounter  ?  Prescription sent for azithromycin and Zofran for as needed use for travel. ?  ?  ? Relevant Medications  ? azithromycin (ZITHROMAX) 250 MG tablet  ? ondansetron (ZOFRAN) 4 MG tablet  ? GAD (generalized anxiety disorder) - Primary  ?  Refilled alprazolam continue to use sparingly. ?  ?  ? Relevant Medications  ? ALPRAZolam (XANAX) 0.5 MG tablet  ? Acute low back pain  ?  She exercises pretty  vigorously daily.  But is starting to notice more aches and pains.  I did refill the  Flexeril it does not sound like she is having any autoimmune type arthritis but we can certainly work-up further if not improving.  Could try taking turmeric again. ?  ?  ? Relevant Medications  ? cyclobenzaprine (FLEXERIL) 10 MG tablet  ? ? ?Meds ordered this encounter  ?Medications  ? azithromycin (ZITHROMAX) 250 MG tablet  ?  Sig: 2 Ttabs PO on Day 1, then one a day x 4 days.  ?  Dispense:  6 tablet  ?  Refill:  0  ? ondansetron (ZOFRAN) 4 MG tablet  ?  Sig: Take 1 tablet (4 mg total) by mouth every 8 (eight) hours as needed for nausea or vomiting.  ?  Dispense:  20 tablet  ?  Refill:  0  ? ALPRAZolam (XANAX) 0.5 MG tablet  ?  Sig: Take 0.5-1 tablets (0.25-0.5 mg total) by mouth daily as needed for anxiety.  ?  Dispense:  10 tablet  ?  Refill:  0  ? cyclobenzaprine (FLEXERIL) 10 MG tablet  ?  Sig: Take 1 tablet (10 mg total) by mouth 3 (three) times daily as needed for muscle spasms.  ?  Dispense:  30 tablet  ?  Refill:  0  ? ? ?Follow-up: Return in about 1 year (around 05/23/2022) for Medication .  ? ? ?Beatrice Lecher, MD ?

## 2021-05-22 NOTE — Assessment & Plan Note (Addendum)
She exercises pretty vigorously daily.  But is starting to notice more aches and pains.  I did refill the Flexeril it does not sound like she is having any autoimmune type arthritis but we can certainly work-up further if not improving.  Could try taking turmeric again. ?

## 2021-05-22 NOTE — Assessment & Plan Note (Signed)
Refilled alprazolam continue to use sparingly. ?

## 2021-05-22 NOTE — Assessment & Plan Note (Addendum)
Prescription sent for azithromycin and Zofran for as needed use for travel. ?

## 2021-05-29 ENCOUNTER — Other Ambulatory Visit: Payer: Self-pay

## 2021-05-29 DIAGNOSIS — R7309 Other abnormal glucose: Secondary | ICD-10-CM

## 2021-05-29 DIAGNOSIS — Z0000 Encounter for general adult medical examination without abnormal findings: Secondary | ICD-10-CM

## 2021-05-29 DIAGNOSIS — E782 Mixed hyperlipidemia: Secondary | ICD-10-CM

## 2021-05-30 LAB — LIPID PANEL W/REFLEX DIRECT LDL
Cholesterol: 170 mg/dL (ref <200)
HDL: 70 mg/dL (ref >=50)
LDL Cholesterol (Calc): 86 mg/dL (calc)
Non-HDL Cholesterol (Calc): 100 mg/dL (calc) (ref <130)
Total CHOL/HDL Ratio: 2.4 (calc) (ref <5.0)
Triglycerides: 53 mg/dL (ref <150)

## 2021-05-30 LAB — COMPLETE METABOLIC PANEL WITH GFR
AG Ratio: 1.7 (calc) (ref 1.0–2.5)
ALT: 14 U/L (ref 6–29)
AST: 16 U/L (ref 10–35)
Albumin: 4.3 g/dL (ref 3.6–5.1)
Alkaline phosphatase (APISO): 39 U/L (ref 31–125)
BUN/Creatinine Ratio: 14 (calc) (ref 6–22)
BUN: 15 mg/dL (ref 7–25)
CO2: 26 mmol/L (ref 20–32)
Calcium: 8.9 mg/dL (ref 8.6–10.2)
Chloride: 106 mmol/L (ref 98–110)
Creat: 1.08 mg/dL — ABNORMAL HIGH (ref 0.50–0.99)
Globulin: 2.6 g/dL (calc) (ref 1.9–3.7)
Glucose, Bld: 86 mg/dL (ref 65–99)
Potassium: 4.4 mmol/L (ref 3.5–5.3)
Sodium: 140 mmol/L (ref 135–146)
Total Bilirubin: 0.6 mg/dL (ref 0.2–1.2)
Total Protein: 6.9 g/dL (ref 6.1–8.1)
eGFR: 64 mL/min/{1.73_m2} (ref >=60)

## 2021-05-30 LAB — CBC WITH DIFFERENTIAL/PLATELET
Absolute Monocytes: 572 cells/uL (ref 200–950)
Basophils Absolute: 39 cells/uL (ref 0–200)
Basophils Relative: 0.7 %
Eosinophils Absolute: 231 cells/uL (ref 15–500)
Eosinophils Relative: 4.2 %
HCT: 39.3 % (ref 35.0–45.0)
Hemoglobin: 13 g/dL (ref 11.7–15.5)
Lymphs Abs: 2123 cells/uL (ref 850–3900)
MCH: 31.8 pg (ref 27.0–33.0)
MCHC: 33.1 g/dL (ref 32.0–36.0)
MCV: 96.1 fL (ref 80.0–100.0)
MPV: 10.3 fL (ref 7.5–12.5)
Monocytes Relative: 10.4 %
Neutro Abs: 2536 cells/uL (ref 1500–7800)
Neutrophils Relative %: 46.1 %
Platelets: 228 10*3/uL (ref 140–400)
RBC: 4.09 10*6/uL (ref 3.80–5.10)
RDW: 11.8 % (ref 11.0–15.0)
Total Lymphocyte: 38.6 %
WBC: 5.5 10*3/uL (ref 3.8–10.8)

## 2021-05-30 LAB — HEMOGLOBIN A1C
Hgb A1c MFr Bld: 5.1 % of total Hgb (ref <5.7)
Mean Plasma Glucose: 100 mg/dL
eAG (mmol/L): 5.5 mmol/L

## 2021-05-31 NOTE — Progress Notes (Signed)
Your lab work is within acceptable range and there are no concerning findings.   ?

## 2021-06-22 ENCOUNTER — Telehealth: Payer: Self-pay | Admitting: *Deleted

## 2021-06-22 NOTE — Telephone Encounter (Signed)
Returned call from 8:43 AM. Left patient a message to call and schedule annual with Dr. Gala Herring for 07/06/2021 or after, if that date does not work with her schedule. ?

## 2021-07-07 ENCOUNTER — Ambulatory Visit (INDEPENDENT_AMBULATORY_CARE_PROVIDER_SITE_OTHER): Payer: 59

## 2021-07-07 VITALS — BP 137/79 | HR 73 | Ht 70.0 in | Wt 157.0 lb

## 2021-07-07 DIAGNOSIS — Z01419 Encounter for gynecological examination (general) (routine) without abnormal findings: Secondary | ICD-10-CM

## 2021-07-07 DIAGNOSIS — Z86018 Personal history of other benign neoplasm: Secondary | ICD-10-CM

## 2021-07-07 NOTE — Progress Notes (Signed)
? ?  Subjective:  ?  ? Emily Herring is a 46 y.o. female here at [facility] for a routine exam.  Current complaints: none ? ?Do you have a primary care provider? yes ? ? ?[place] Office Visit from 05/22/2021 in [place]  ?PHQ-2 Total Score 0  ? ?  ? ? ?There are no preventive care reminders to display for this patient.  ? ?Risk factors for chronic health problems: ?Smoking: no ?Alchohol/how much: 2-3 drinks daily ?Illicit drug use: no ?Pt BMI: Body mass index is 22.53 kg/m?. ?  ?Gynecologic History ?Patient's last menstrual period was 06/28/2021. ?Contraception: none ?Sexual health: 1 female partner, no issues ?Last Pap: 04/07/2020. Results were: normal ?Last mammogram: 07/2020. Results were: normal ? ?Obstetric History ?OB History  ?Gravida Para Term Preterm AB Living  ?0 0 0 0 0 0  ?SAB IAB Ectopic Multiple Live Births  ?0 0 0 0 0  ? ? ? ?The following portions of the patient's history were reviewed and updated as appropriate: allergies, current medications, past family history, past medical history, past social history, past surgical history, and problem list. ? ?Review of Systems ?Pertinent items are noted in HPI.  ?  ?Objective:  ? ?BP 137/79   Pulse 73   Ht 5' 10"  (1.778 m)   Wt 157 lb (71.2 kg)   LMP 06/28/2021   BMI 22.53 kg/m?  ?VS reviewed, nursing note reviewed,  ?Constitutional: well developed, well nourished, no distress ?HEENT: normocephalic ?CV: normal rate ?Pulm/chest wall: normal effort ?Breast Exam: Performed: right breast normal without mass, skin or nipple changes or axillary nodes, left breast normal without mass, skin or nipple changes or axillary nodes ?Abdomen: soft ?Neuro: alert and oriented x 3 ?Skin: warm, dry ?Psych: affect normal ?Pelvic exam: Deferred ?   ?Assessment/Plan:  ? ?1. Well woman exam ?- Pap up to date ?- No GYN concerns ? ?- MM Digital Screening; Future ?- Cologuard ? ?2. History of prolactinoma ?- Requesting Prolactin level check  given history of prolactinoma ? ?- Prolactin ? ? ?Return in about 1 year (around 07/08/2022).  ? ?Renee Harder, CNM ?3:00 PM  ? ?

## 2021-07-08 LAB — PROLACTIN: Prolactin: 13 ng/mL

## 2021-07-17 LAB — COLOGUARD: Cologuard: NEGATIVE

## 2021-07-27 LAB — COLOGUARD: COLOGUARD: NEGATIVE

## 2021-08-20 ENCOUNTER — Ambulatory Visit (INDEPENDENT_AMBULATORY_CARE_PROVIDER_SITE_OTHER): Payer: 59

## 2021-08-20 DIAGNOSIS — Z1231 Encounter for screening mammogram for malignant neoplasm of breast: Secondary | ICD-10-CM

## 2021-08-20 DIAGNOSIS — Z01419 Encounter for gynecological examination (general) (routine) without abnormal findings: Secondary | ICD-10-CM

## 2021-10-21 DIAGNOSIS — D225 Melanocytic nevi of trunk: Secondary | ICD-10-CM | POA: Diagnosis not present

## 2021-10-21 DIAGNOSIS — B078 Other viral warts: Secondary | ICD-10-CM | POA: Diagnosis not present

## 2021-10-21 DIAGNOSIS — L578 Other skin changes due to chronic exposure to nonionizing radiation: Secondary | ICD-10-CM | POA: Diagnosis not present

## 2021-10-21 DIAGNOSIS — D2261 Melanocytic nevi of right upper limb, including shoulder: Secondary | ICD-10-CM | POA: Diagnosis not present

## 2021-10-21 DIAGNOSIS — D2262 Melanocytic nevi of left upper limb, including shoulder: Secondary | ICD-10-CM | POA: Diagnosis not present

## 2021-10-21 DIAGNOSIS — L821 Other seborrheic keratosis: Secondary | ICD-10-CM | POA: Diagnosis not present

## 2021-10-21 DIAGNOSIS — L570 Actinic keratosis: Secondary | ICD-10-CM | POA: Diagnosis not present

## 2021-10-21 DIAGNOSIS — L918 Other hypertrophic disorders of the skin: Secondary | ICD-10-CM | POA: Diagnosis not present

## 2021-11-12 ENCOUNTER — Encounter (INDEPENDENT_AMBULATORY_CARE_PROVIDER_SITE_OTHER): Payer: 59 | Admitting: Ophthalmology

## 2021-11-12 DIAGNOSIS — H35413 Lattice degeneration of retina, bilateral: Secondary | ICD-10-CM | POA: Diagnosis not present

## 2021-11-12 DIAGNOSIS — H43813 Vitreous degeneration, bilateral: Secondary | ICD-10-CM

## 2021-11-12 DIAGNOSIS — H33303 Unspecified retinal break, bilateral: Secondary | ICD-10-CM

## 2022-02-01 ENCOUNTER — Encounter: Payer: Self-pay | Admitting: Family Medicine

## 2022-02-01 ENCOUNTER — Ambulatory Visit: Payer: 59 | Admitting: Family Medicine

## 2022-02-01 VITALS — BP 125/83 | HR 80 | Ht 70.0 in | Wt 163.0 lb

## 2022-02-01 DIAGNOSIS — F341 Dysthymic disorder: Secondary | ICD-10-CM | POA: Diagnosis not present

## 2022-02-01 DIAGNOSIS — F5101 Primary insomnia: Secondary | ICD-10-CM

## 2022-02-01 DIAGNOSIS — R69 Illness, unspecified: Secondary | ICD-10-CM | POA: Diagnosis not present

## 2022-02-01 MED ORDER — TRAZODONE HCL 50 MG PO TABS: 25.0000 mg | ORAL_TABLET | Freq: Every evening | ORAL | 1 refills | Status: DC | PRN

## 2022-02-01 NOTE — Progress Notes (Signed)
   I did take a history and examined the patient personally and agree with the assessment and plan below.  In addition I do think there is some component of PMDD and so we also discussed the possibility of using a low-dose SSRI for that as well especially since these episodes of depression tend to be focused around her menstrual cycle.  For now working to really focus on trying to improve her sleep quality and see if that helps first.

## 2022-02-01 NOTE — Progress Notes (Signed)
Established Patient Office Visit  Subjective   Patient ID: Emily Herring, female    DOB: 1975/03/09  Age: 46 y.o. MRN: [id number]  Chief Complaint  Patient presents with   Depression    Lower back pain    Depression        Associated symptoms include insomnia.   Patient is a 46 year old female presenting to clinic for insomnia, depressed mood, and back pain. She has been struggling with insomnia for awhile. She says she can get to sleep after reading for an hour. However, she struggles staying asleep due to pain and "tossing and turning." There have been a few instances when she is sleep deprived, she gets depressed. The last time this occurred was in October when she was traveling back from [place]. She struggled with jet lag and started her menstrual cycle around the same time. During this time, she took some xanax which helped "take the edge off." She is currently not experiencing any depression symptoms and says she is happy with her life right now.   She also fell two weeks ago at work down a few stairs and hit her lower back. She has a history of chronic lower back pain and has some  flexeril prescribed for as needed pain. She has been taking this to help with pain. The medication makes her drowsy so she has been taking it at night which has helped with her insomnia. Her wife has been going to PT for lower back pain, so she has been doing the same exercises and stretches at home. These exercises and the flexeril have helped. Overall, back pain is improving. She denies any decreased strength, numbness, tingling.   Review of Systems  Musculoskeletal:  Positive for back pain and joint pain.  Psychiatric/Behavioral:  Positive for depression. The patient has insomnia.   All other systems reviewed and are negative.      Objective:     BP 125/83 (BP Location: Left Arm, Patient Position: Sitting, Cuff Size: Large)   Pulse 80   Ht '5\' 10"'$  (1.778 m)   Wt 74 kg   SpO2 95%   BMI  23.39 kg/m    Physical Exam Constitutional:      Appearance: Normal appearance.  Cardiovascular:     Rate and Rhythm: Normal rate and regular rhythm.  Pulmonary:     Effort: Pulmonary effort is normal.     Breath sounds: Normal breath sounds.  Neurological:     General: No focal deficit present.     Mental Status: She is alert.  Psychiatric:        Mood and Affect: Mood normal.        Behavior: Behavior normal.     ..    02/01/2022    2:12 PM 05/22/2021    3:15 PM 10/20/2018    1:16 PM 06/19/2018   10:20 AM 05/23/2018    2:29 PM  Depression screen PHQ 2/9  Decreased Interest 1 0 0 0 0  Down, Depressed, Hopeless 1 0 0 0 1  PHQ - 2 Score 2 0 0 0 1  Altered sleeping '3 2  1   '$ Tired, decreased energy '2 1  1   '$ Change in appetite 0 0  0   Feeling bad or failure about yourself  0 0  0   Trouble concentrating 3 0  0   Moving slowly or fidgety/restless 0 0  0   Suicidal thoughts 1 0  0   PHQ-9 Score  $'11 3  2   'Z$ Difficult doing work/chores Somewhat difficult Not difficult at all  Not difficult at all   .Marland Kitchen    02/01/2022    2:12 PM 05/22/2021    3:15 PM 10/20/2018    1:15 PM 06/19/2018   10:19 AM  GAD 7 : Generalized Anxiety Score  Nervous, Anxious, on Edge 1 0 1 0  Control/stop worrying 1 0 0 0  Worry too much - different things 1 0 0 0  Trouble relaxing 1 0 1 0  Restless 1 0 0 0  Easily annoyed or irritable 1 0 1 0  Afraid - awful might happen 0 0 0 0  Total GAD 7 Score 6 0 3 0  Anxiety Difficulty Somewhat difficult Not difficult at all Not difficult at all Not difficult at all      No results found for any visits on 02/01/22.    The 10-year ASCVD risk score (Arnett DK, et al., 2019) is: 0.5%    Assessment & Plan:   Problem List Items Addressed This Visit   None Visit Diagnoses     Primary insomnia    -  Primary   Relevant Medications   traZODone (DESYREL) 50 MG tablet       Insomnia and Depressed Mood Prescribed trazodone.  We discussed other options if  she believes trazodone is not helping.  If symptoms have not improved at follow up in 6 weeks, we can consider trying amitriptyline or SSRI.  Back Pain Overall improving with at home exercises and stretches.  Continue flexeril as needed for pain.   Return in about 6 weeks (around 03/15/2022) for sleep and mood.    Dorian Heckle, Student-PA

## 2022-03-10 ENCOUNTER — Other Ambulatory Visit: Payer: Self-pay | Admitting: Family Medicine

## 2022-03-10 DIAGNOSIS — F5101 Primary insomnia: Secondary | ICD-10-CM

## 2022-03-19 ENCOUNTER — Ambulatory Visit: Payer: 59 | Admitting: Family Medicine

## 2022-03-22 ENCOUNTER — Ambulatory Visit: Payer: 59 | Admitting: Family Medicine

## 2022-03-22 ENCOUNTER — Encounter: Payer: Self-pay | Admitting: Family Medicine

## 2022-03-22 VITALS — BP 133/79 | HR 53 | Ht 70.0 in | Wt 165.0 lb

## 2022-03-22 DIAGNOSIS — B009 Herpesviral infection, unspecified: Secondary | ICD-10-CM | POA: Diagnosis not present

## 2022-03-22 DIAGNOSIS — F5101 Primary insomnia: Secondary | ICD-10-CM | POA: Diagnosis not present

## 2022-03-22 DIAGNOSIS — R69 Illness, unspecified: Secondary | ICD-10-CM | POA: Diagnosis not present

## 2022-03-22 DIAGNOSIS — Z7184 Encounter for health counseling related to travel: Secondary | ICD-10-CM | POA: Diagnosis not present

## 2022-03-22 DIAGNOSIS — F341 Dysthymic disorder: Secondary | ICD-10-CM | POA: Diagnosis not present

## 2022-03-22 DIAGNOSIS — Z23 Encounter for immunization: Secondary | ICD-10-CM | POA: Diagnosis not present

## 2022-03-22 MED ORDER — VALACYCLOVIR HCL 1 G PO TABS: 1000.0000 mg | ORAL_TABLET | Freq: Two times a day (BID) | ORAL | 5 refills | Status: DC

## 2022-03-22 MED ORDER — AZITHROMYCIN 250 MG PO TABS: ORAL_TABLET | ORAL | 0 refills | Status: AC

## 2022-03-22 NOTE — Assessment & Plan Note (Signed)
Would like a refill on the medication it has been a few years since we have sent 1 in.

## 2022-03-22 NOTE — Assessment & Plan Note (Signed)
She is actually doing really well on the medication.  PHQ-9 score of 0 and GAD-7 score of 0.  Will continue with trazodone 50 mg nightly.

## 2022-03-22 NOTE — Assessment & Plan Note (Signed)
Has been working well.  Refills sent to pharmacy.

## 2022-03-22 NOTE — Progress Notes (Signed)
   Established Patient Office Visit  Subjective   Patient ID: Emily Herring, female    DOB: 12-11-1975  Age: 47 y.o. MRN: [id number]  Chief Complaint  Patient presents with   Insomnia   Anxiety    HPI  Following up for insomnia and mood-trazodone seems to be working really well.  Taking it about 2 hours before bedtime.  Waking up feeling refreshed and really has noticed a big difference in mood as well it has been helpful.  She has noticed a little bit more joint aches but is otherwise feeling better.  She also be traveling out of the country twice this year and wondered if she could get a prescription for antibiotic to take with her on the trip such as a Z-Pak.     ROS    Objective:     BP 133/79   Pulse (!) 53   Ht '5\' 10"'$  (1.778 m)   Wt 165 lb (74.8 kg)   SpO2 100%   BMI 23.68 kg/m    Physical Exam Vitals and nursing note reviewed.  Constitutional:      Appearance: She is well-developed.  HENT:     Head: Normocephalic and atraumatic.  Cardiovascular:     Rate and Rhythm: Normal rate and regular rhythm.     Heart sounds: Normal heart sounds.  Pulmonary:     Effort: Pulmonary effort is normal.     Breath sounds: Normal breath sounds.  Skin:    General: Skin is warm and dry.  Neurological:     Mental Status: She is alert and oriented to person, place, and time.  Psychiatric:        Behavior: Behavior normal.      No results found for any visits on 03/22/22.    The 10-year ASCVD risk score (Arnett DK, et al., 2019) is: 0.6%    Assessment & Plan:   Problem List Items Addressed This Visit       Other   Primary insomnia    Has been working well.  Refills sent to pharmacy.      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      Relevant Medications   azithromycin (ZITHROMAX) 250 MG tablet   valACYclovir (VALTREX) 1000 MG tablet   DEPRESSION/ANXIETY     She is actually doing really well on the medication.  PHQ-9 score of 0 and GAD-7 score of 0.  Will continue with trazodone 50 mg nightly.      Other Visit Diagnoses     Encounter for immunization       Relevant Orders   [place] Fall 2023 Covid-19 Vaccine 76yr and older (Completed)   Travel advice encounter           COVID vaccine given today.    Also given a prescription for Z-Pak to take with her for travel.  Return in about 6 months (around 09/20/2022) for sleep adn mood .    CBeatrice Lecher MD

## 2022-05-26 ENCOUNTER — Other Ambulatory Visit: Payer: Self-pay | Admitting: Family Medicine

## 2022-05-26 DIAGNOSIS — F5101 Primary insomnia: Secondary | ICD-10-CM

## 2022-06-22 ENCOUNTER — Telehealth: Payer: Self-pay | Admitting: *Deleted

## 2022-06-22 NOTE — Telephone Encounter (Signed)
Returned call from 1:13 PM. Patient not able to schedule due to not having calendar with her. She will call back when she does.

## 2022-07-01 ENCOUNTER — Encounter: Payer: Self-pay | Admitting: Emergency Medicine

## 2022-07-01 ENCOUNTER — Ambulatory Visit
Admission: EM | Admit: 2022-07-01 | Discharge: 2022-07-01 | Disposition: A | Discharge status: Home / Self Care | Payer: 59 | Attending: Family Medicine | Admitting: Family Medicine

## 2022-07-01 ENCOUNTER — Ambulatory Visit (INDEPENDENT_AMBULATORY_CARE_PROVIDER_SITE_OTHER): Payer: 59

## 2022-07-01 DIAGNOSIS — R1032 Left lower quadrant pain: Secondary | ICD-10-CM | POA: Diagnosis not present

## 2022-07-01 DIAGNOSIS — R1110 Vomiting, unspecified: Secondary | ICD-10-CM | POA: Diagnosis not present

## 2022-07-01 DIAGNOSIS — R509 Fever, unspecified: Secondary | ICD-10-CM | POA: Diagnosis not present

## 2022-07-01 DIAGNOSIS — D259 Leiomyoma of uterus, unspecified: Secondary | ICD-10-CM | POA: Diagnosis not present

## 2022-07-01 LAB — POCT URINALYSIS DIP (MANUAL ENTRY)
Bilirubin, UA: NEGATIVE
Glucose, UA: NEGATIVE mg/dL
Ketones, POC UA: NEGATIVE mg/dL
Leukocytes, UA: NEGATIVE
Nitrite, UA: NEGATIVE
Protein Ur, POC: NEGATIVE mg/dL
Spec Grav, UA: 1.02 (ref 1.010–1.025)
Urobilinogen, UA: 1 E.U./dL
pH, UA: 7 (ref 5.0–8.0)

## 2022-07-01 MED ORDER — ONDANSETRON HCL 8 MG PO TABS: 8.0000 mg | ORAL_TABLET | Freq: Three times a day (TID) | ORAL | 0 refills | Status: DC | PRN

## 2022-07-01 MED ORDER — NAPROXEN 375 MG PO TABS: 375.0000 mg | ORAL_TABLET | Freq: Two times a day (BID) | ORAL | 0 refills | Status: DC

## 2022-07-01 MED ORDER — ONDANSETRON 4 MG PO TBDP: 4.0000 mg | ORAL_TABLET | Freq: Once | ORAL | Status: AC

## 2022-07-01 MED ORDER — ONDANSETRON 4 MG PO TBDP
4.0000 mg | ORAL_TABLET | Freq: Once | ORAL | Status: AC
  Administered 2022-07-01: 4 mg via ORAL

## 2022-07-01 MED ORDER — TRAMADOL-ACETAMINOPHEN 37.5-325 MG PO TABS: 1.0000 | ORAL_TABLET | Freq: Four times a day (QID) | ORAL | 0 refills | Status: DC | PRN

## 2022-07-01 MED ORDER — SODIUM CHLORIDE 0.9 % IV BOLUS
1000.0000 mL | Freq: Once | INTRAVENOUS | Status: AC
  Administered 2022-07-01: 1000 mL via INTRAVENOUS

## 2022-07-01 MED ORDER — IOHEXOL 300 MG/ML  SOLN
100.0000 mL | Freq: Once | INTRAMUSCULAR | Status: AC | PRN
Start: 2022-07-01 — End: 2022-07-01
  Administered 2022-07-01: 100 mL via INTRAVENOUS

## 2022-07-01 NOTE — ED Provider Notes (Signed)
[facility]    CSN: [id number] Arrival date & time: 07/01/22  0814      History   Chief Complaint Chief Complaint  Patient presents with   Emesis    HPI Emily Herring is a 47 y.o. female.   HPI  Very pleasant healthy fit 47 year old woman who was doing her usual exercise workout yesterday.  As she was completing a workout she sat down and had a wave of nausea and felt bad.  After this she started vomiting.  She vomited continually from 4 PM to 8 PM.  At that time she remembered she had a prescription for Zofran given for previous problems.  She took a Zofran but is able to sleep for few hours.  When she woke up she had increased abdominal pain.  She feels like she had a low-grade fever.  Did not take her temperature. Patient has had no abdominal surgeries. She has not had a colonoscopy but her Cologuard was negative. She does have a history of uterine fibroids and has had an endometrial ablation. He has had a history of kidney stones many years ago.  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 Authorizing Provider  ALPRAZolam Prudy Feeler) 0.5 MG tablet Take 0.5-1 tablets (0.25-0.5 mg total) by mouth daily as needed for anxiety. 05/22/21  Yes Agapito Games, MD  fluticasone (FLONASE) 50 MCG/ACT nasal spray Place into both nostrils daily.   Yes [provider]  loratadine (CLARITIN) 10 MG tablet Take 10 mg by mouth daily.   Yes [provider]  naproxen (NAPROSYN) 375 MG tablet Take 1 tablet (375 mg total) by mouth 2 (two) times daily. 07/01/22  Yes Eustace Moore, MD  ondansetron (ZOFRAN) 8 MG tablet Take 1 tablet (8 mg total) by mouth every 8 (eight) hours as needed for nausea or vomiting. 07/01/22  Yes Eustace Moore, MD  traMADol-acetaminophen (ULTRACET) 37.5-325 MG tablet Take 1-2 tablets by mouth every 6 (six) hours as needed. 07/01/22  Yes Eustace Moore, MD  traZODone (DESYREL) 50 MG tablet TAKE 1/2 TABLET TO 2 TABLET BY MOUTH AT BEDTIME AS NEEDED FOR SLEEP 05/26/22  Yes Agapito Games, MD  valACYclovir (VALTREX) 1000 MG tablet Take 1 tablet (1,000 mg total)  by mouth 2 (two) times daily. 03/22/22  Yes Agapito Games, MD    Family History Family History  Problem Relation Age of Onset   Hypertension Father    Dementia Father    Diabetes Father    Alzheimer's disease Maternal Grandmother     Social History Social History   Tobacco Use   Smoking status: Former   Smokeless tobacco: Former   Tobacco comments:    Archivist Use: Never used  Substance Use Topics   Alcohol use: Yes    Comment: 1-2 per wk   Drug use: No     Allergies   Morphine and related, Percocet [oxycodone-acetaminophen], Amitriptyline, and Topiramate   Review of Systems Review of Systems See HPI  Physical Exam Triage Vital Signs ED Triage Vitals  Enc Vitals Group     BP 07/01/22 0836 111/75     Pulse Rate 07/01/22 0836 71     Resp 07/01/22  0836 16     Temp 07/01/22 0836 98.3 F (36.8 C)     Temp Source 07/01/22 0836 Oral     SpO2 07/01/22 0836 96 %     Weight 07/01/22 0837 159 lb (72.1 kg)     Height 07/01/22 0837 5\' 10"  (1.778 m)     Head Circumference --      Peak Flow --      Pain Score 07/01/22 0837 6     Pain Loc --      Pain Edu? --      Excl. in GC? --    No data found.  Updated Vital Signs BP 133/81 (BP Location: Left Arm)   Pulse (!) 51   Temp 97.7 F (36.5 C) (Oral)   Resp 16   Ht 5\' 10"  (1.778 m)   Wt 72.1 kg   LMP 06/28/2022   SpO2 100%   BMI 22.81 kg/m       Physical Exam Constitutional:      General: She is not in acute distress.    Appearance: Normal appearance. She is well-developed and normal weight. She is ill-appearing.     Comments: Guarded movements, is uncomfortable  HENT:     Head: Normocephalic and atraumatic.     Right Ear: Tympanic membrane normal.     Left Ear: Tympanic membrane normal.     Nose: Nose normal. No congestion.     Mouth/Throat:     Mouth: Mucous membranes are moist.     Pharynx: No posterior oropharyngeal erythema.  Eyes:     Conjunctiva/sclera: Conjunctivae normal.     Pupils: Pupils are equal, round, and reactive to light.  Cardiovascular:     Rate and Rhythm: Normal rate and regular rhythm.     Heart sounds: Normal heart sounds.  Pulmonary:     Effort: Pulmonary effort is normal. No respiratory distress.     Breath sounds: Normal breath sounds.  Chest:     Chest wall: No tenderness.  Abdominal:     General: Abdomen is flat. There is no distension.     Palpations: Abdomen is soft.     Tenderness: There is abdominal tenderness. There is guarding. There is no right CVA tenderness, left CVA tenderness or rebound.     Comments: Patient's exam is acutely tender in the left mid and lower quadrants to deep palpation.  No palpable mass.  There is guarding but no rebound.  Musculoskeletal:        General: Normal range  of motion.     Cervical back: Normal  range of motion.  Skin:    General: Skin is warm and dry.  Neurological:     General: No focal deficit present.     Mental Status: She is alert.  Psychiatric:        Mood and Affect: Mood normal.      UC Treatments / Results  Labs (all labs ordered are listed, but only abnormal results are displayed) Labs Reviewed  POCT URINALYSIS DIP (MANUAL ENTRY) - Abnormal; Notable for the following components:      Result Value   Blood, UA trace-intact (*)    All other components within normal limits    EKG   Radiology CT ABDOMEN PELVIS W CONTRAST  Result Date: 07/01/2022 CLINICAL DATA:  LLQ abdominal pain left mid and lower abdominal tenderness, pain, fever, vomiting. H/O kidney stones EXAM: CT ABDOMEN AND PELVIS WITH CONTRAST TECHNIQUE: Multidetector CT imaging of the abdomen and pelvis was performed using the standard protocol following bolus administration of intravenous contrast. RADIATION DOSE REDUCTION: This exam was performed according to the departmental dose-optimization program which includes automated exposure control, adjustment of the mA and/or kV according to patient size and/or use of iterative reconstruction technique. CONTRAST:  OMNIPAQUE IOHEXOL 300 MG/ML  SOLN COMPARISON:  February 08, 2018 FINDINGS: Lower chest: No acute abnormality. Hepatobiliary: Focal fatty deposition adjacent to the falciform ligament. Gallbladder is unremarkable. No intrahepatic or extrahepatic biliary ductal dilation. Pancreas: Unremarkable. No pancreatic ductal dilatation or surrounding inflammatory changes. Spleen: Normal in size without focal abnormality. Adrenals/Urinary Tract: Adrenal glands are unremarkable. Kidneys enhance symmetrically. No hydronephrosis. No obstructing nephrolithiasis. Bladder is unremarkable. Stomach/Bowel: No evidence of bowel obstruction. Appendix is normal. Vascular/Lymphatic: No significant vascular findings are present. No enlarged abdominal or pelvic lymph nodes.  Reproductive: There is a exophytic possibly pedunculated fibroid extending from the fundus which is increased in size in comparison prior. It contains more internal cystic density which could denote cystic degeneration. It measures approximately 3.7 x 2.8 by 4.3 cm, previously 2.2 x 1.5 by 2.4 cm. Adnexa are unremarkable. Other: Small volume free fluid in the pelvis. Musculoskeletal: Levocurvature of the lumbar spine mild degenerative changes centered at L3-4. IMPRESSION: 1. No acute findings in the abdomen or pelvis. 2. Increase in size of a exophytic possibly pedunculated fibroid extending from the uterine fundus. It contains more internal cystic density which could denote cystic degeneration. This could reflect a source of pain in the appropriate clinical setting. 3. Small volume free fluid in the pelvis is likely physiologic. Electronically Signed   By: Meda Klinefelter M.D.   On: 07/01/2022 12:44    Procedures Procedures (including critical care time)  Medications Ordered in UC Medications  ondansetron (ZOFRAN-ODT) disintegrating tablet 4 mg (4 mg Oral Given 07/01/22 0958)  ondansetron (ZOFRAN-ODT) disintegrating tablet 4 mg (0 mg Oral Duplicate 07/01/22 1022)  sodium chloride 0.9 % bolus 1,000 mL (0 mLs Intravenous Stopped 07/01/22 1307)    Initial Impression / Assessment and Plan / UC Course  I have reviewed the triage vital signs and the nursing notes.  Pertinent labs & imaging results that were available during my care of the patient were reviewed by me and considered in my medical decision making (see chart for details).     Patient has an acutely tender abdomen.  Has not had a colonoscopy.  Cologuard test was negative at age 25.  She has had a history of kidney stones.  Does not have significant  hematuria or any CVA tenderness.  With her fever and abdominal pain I have concern for diverticulitis/colitis/lower concern for pyelonephritis.  Will get additional imaging Final Clinical  Impressions(s) / UC Diagnoses   Final diagnoses:  Abdominal pain, left lower quadrant  Degeneration of uterine fibroid     Discharge Instructions      Take Zofran as needed for nausea Make sure you are drinking plenty of fluids Take naproxen 2 times a day with food.  This is an anti-inflammatory to reduce the pain and swelling of the fibroid Take the Ultracet (tramadol acetaminophen) as needed for more severe pain.  May take up to 2 pills 3 times a day Call Dr. Penne Lash for follow-up if not improving by Monday     ED Prescriptions     Medication Sig Dispense Auth. Provider   naproxen (NAPROSYN) 375 MG tablet Take 1 tablet (375 mg total) by mouth 2 (two) times daily. 20 tablet Eustace Moore, MD   traMADol-acetaminophen (ULTRACET) 37.5-325 MG tablet Take 1-2 tablets by mouth every 6 (six) hours as needed. 20 tablet Eustace Moore, MD   ondansetron (ZOFRAN) 8 MG tablet Take 1 tablet (8 mg total) by mouth every 8 (eight) hours as needed for nausea or vomiting. 20 tablet Eustace Moore, MD      I have reviewed the PDMP during this encounter.   Eustace Moore, MD 07/01/22 [phone]

## 2022-07-01 NOTE — Discharge Instructions (Addendum)
Take Zofran as needed for nausea Make sure you are drinking plenty of fluids Take naproxen 2 times a day with food.  This is an anti-inflammatory to reduce the pain and swelling of the fibroid Take the Ultracet (tramadol acetaminophen) as needed for more severe pain.  May take up to 2 pills 3 times a day Call Dr. Penne Lash for follow-up if not improving by Monday

## 2022-07-01 NOTE — ED Triage Notes (Signed)
Patient states that she began vomiting around 4:30pm yesterday after working out.  Patient did have LLQ pain that has continued today.  No diarrhea.  Patient has taken Zofran which has helped.

## 2022-07-02 ENCOUNTER — Telehealth: Payer: Self-pay | Admitting: Emergency Medicine

## 2022-07-02 NOTE — Telephone Encounter (Signed)
LMTRC.  Advised if patient is doing well then she can disregard the call.  Any questions or concerns, feel free to contact the office.

## 2022-07-20 ENCOUNTER — Other Ambulatory Visit: Payer: Self-pay | Admitting: Family Medicine

## 2022-07-20 DIAGNOSIS — F5101 Primary insomnia: Secondary | ICD-10-CM

## 2022-07-21 NOTE — Progress Notes (Unsigned)
Last pap 04/07/20- negative Last mammogram- 08/20/21- negative

## 2022-07-22 ENCOUNTER — Encounter: Payer: Self-pay | Admitting: Family Medicine

## 2022-07-22 ENCOUNTER — Ambulatory Visit (INDEPENDENT_AMBULATORY_CARE_PROVIDER_SITE_OTHER): Payer: 59 | Admitting: Family Medicine

## 2022-07-22 VITALS — BP 144/86 | HR 61 | Ht 70.0 in | Wt 155.0 lb

## 2022-07-22 DIAGNOSIS — Z01419 Encounter for gynecological examination (general) (routine) without abnormal findings: Secondary | ICD-10-CM

## 2022-07-22 DIAGNOSIS — Z1231 Encounter for screening mammogram for malignant neoplasm of breast: Secondary | ICD-10-CM

## 2022-07-22 DIAGNOSIS — D250 Submucous leiomyoma of uterus: Secondary | ICD-10-CM | POA: Diagnosis not present

## 2022-07-22 DIAGNOSIS — D352 Benign neoplasm of pituitary gland: Secondary | ICD-10-CM | POA: Diagnosis not present

## 2022-07-22 DIAGNOSIS — D259 Leiomyoma of uterus, unspecified: Secondary | ICD-10-CM | POA: Insufficient documentation

## 2022-07-22 NOTE — Assessment & Plan Note (Signed)
16109 - check levels

## 2022-07-22 NOTE — Assessment & Plan Note (Signed)
16109 - discussed nature of degeneration, should not happen again, changes related to hormones, unlikely to need further treatment or cause further issues. Does not cause pain or bleeding now.

## 2022-07-22 NOTE — Progress Notes (Signed)
Subjective:     Emily Herring is a 47 y.o. female and is here for a comprehensive physical exam. The patient reports problems - recent visit to UC . Severe stomach pain and N/V 5/1. Came to urgent care and noted to have possible degenerating fibroid. On Tramadol x 1 week and now pain is resolved. Had known pedunculated fibroid, which had enlarged from prior imaging. She is s/p endometrial ablation and has only very light cycles. They are regular. She has prolactinoma, and monitors levels only, see endocrinology. Gets visual field testing through them. Has not tolerated cabergoline.   The following portions of the patient's history were reviewed and updated as appropriate: allergies, current medications, past family history, past medical history, past social history, past surgical history, and problem list.  Review of Systems Pertinent items noted in HPI and remainder of comprehensive ROS otherwise negative.   Objective:  Chaperone present for exam   BP (!) 144/86   Pulse 61   Ht 5\' 10"  (1.778 m)   Wt 155 lb (70.3 kg)   LMP 06/28/2022   BMI 22.24 kg/m  General appearance: alert, cooperative, and appears stated age Head: Normocephalic, without obvious abnormality, atraumatic Neck: no adenopathy, supple, symmetrical, trachea midline, and thyroid not enlarged, symmetric, no tenderness/mass/nodules Lungs: clear to auscultation bilaterally Breasts: normal appearance, no masses or tenderness Heart: regular rate and rhythm, S1, S2 normal, no murmur, click, rub or gallop Abdomen: soft, non-tender; bowel sounds normal; no masses,  no organomegaly Extremities: extremities normal, atraumatic, no cyanosis or edema Pulses: 2+ and symmetric Skin: Skin color, texture, turgor normal. No rashes or lesions Lymph nodes: Cervical, supraclavicular, and axillary nodes normal. Neurologic: Grossly normal    Assessment:    Healthy female exam.      Plan:   Problem List Items Addressed This Visit        Unprioritized   Prolactinoma (HCC)    16109 - check levels      Fibroid uterus    99213 - discussed nature of degeneration, should not happen again, changes related to hormones, unlikely to need further treatment or cause further issues. Does not cause pain or bleeding now.      Other Visit Diagnoses     Encounter for gynecological examination without abnormal finding    -  Primary   [phone] - pap due 2025   Relevant Orders   Prolactin   CBC   TSH   Comprehensive metabolic panel   Lipid panel   Hemoglobin A1c   Encounter for screening mammogram for malignant neoplasm of breast       [phone] - mammogram ordered   Relevant Orders   MM Digital Screening      Return in 1 year (on 07/22/2023).     See After Visit Summary for Counseling Recommendations

## 2022-07-23 LAB — CBC
Hematocrit: 39.7 % (ref 34.0–46.6)
Hemoglobin: 13.1 g/dL (ref 11.1–15.9)
MCH: 31.6 pg (ref 26.6–33.0)
MCHC: 33 g/dL (ref 31.5–35.7)
MCV: 96 fL (ref 79–97)
Platelets: 281 10*3/uL (ref 150–450)
RBC: 4.14 x10E6/uL (ref 3.77–5.28)
RDW: 12.1 % (ref 11.7–15.4)
WBC: 8.3 10*3/uL (ref 3.4–10.8)

## 2022-07-23 LAB — LIPID PANEL
Chol/HDL Ratio: 2.6 ratio (ref 0.0–4.4)
Cholesterol, Total: 162 mg/dL (ref 100–199)
HDL: 62 mg/dL (ref >39)
LDL Chol Calc (NIH): 88 mg/dL (ref 0–99)
Triglycerides: 60 mg/dL (ref 0–149)
VLDL Cholesterol Cal: 12 mg/dL (ref 5–40)

## 2022-07-23 LAB — COMPREHENSIVE METABOLIC PANEL
ALT: 23 IU/L (ref 0–32)
AST: 17 IU/L (ref 0–40)
Albumin/Globulin Ratio: 2.1 (ref 1.2–2.2)
Albumin: 4.7 g/dL (ref 3.9–4.9)
Alkaline Phosphatase: 41 IU/L — ABNORMAL LOW (ref 44–121)
BUN/Creatinine Ratio: 17 (ref 9–23)
BUN: 18 mg/dL (ref 6–24)
Bilirubin Total: 0.8 mg/dL (ref 0.0–1.2)
CO2: 24 mmol/L (ref 20–29)
Calcium: 9.4 mg/dL (ref 8.7–10.2)
Chloride: 101 mmol/L (ref 96–106)
Creatinine, Ser: 1.09 mg/dL — ABNORMAL HIGH (ref 0.57–1.00)
Globulin, Total: 2.2 g/dL (ref 1.5–4.5)
Glucose: 89 mg/dL (ref 70–99)
Potassium: 4.2 mmol/L (ref 3.5–5.2)
Sodium: 138 mmol/L (ref 134–144)
Total Protein: 6.9 g/dL (ref 6.0–8.5)
eGFR: 63 mL/min/{1.73_m2} (ref >59)

## 2022-07-23 LAB — PROLACTIN: Prolactin: 38.3 ng/mL — ABNORMAL HIGH (ref 4.8–33.4)

## 2022-07-23 LAB — HEMOGLOBIN A1C
Est. average glucose Bld gHb Est-mCnc: 111 mg/dL
Hgb A1c MFr Bld: 5.5 % (ref 4.8–5.6)

## 2022-07-23 LAB — TSH: TSH: 0.612 u[IU]/mL (ref 0.450–4.500)

## 2022-09-20 ENCOUNTER — Encounter: Payer: Self-pay | Admitting: Family Medicine

## 2022-09-20 ENCOUNTER — Ambulatory Visit: Payer: 59 | Admitting: Family Medicine

## 2022-09-20 VITALS — BP 109/66 | HR 54 | Ht 70.0 in | Wt 157.0 lb

## 2022-09-20 DIAGNOSIS — B009 Herpesviral infection, unspecified: Secondary | ICD-10-CM

## 2022-09-20 DIAGNOSIS — M545 Low back pain, unspecified: Secondary | ICD-10-CM

## 2022-09-20 DIAGNOSIS — F411 Generalized anxiety disorder: Secondary | ICD-10-CM | POA: Diagnosis not present

## 2022-09-20 DIAGNOSIS — G8929 Other chronic pain: Secondary | ICD-10-CM | POA: Diagnosis not present

## 2022-09-20 DIAGNOSIS — F341 Dysthymic disorder: Secondary | ICD-10-CM | POA: Diagnosis not present

## 2022-09-20 DIAGNOSIS — F5101 Primary insomnia: Secondary | ICD-10-CM | POA: Diagnosis not present

## 2022-09-20 DIAGNOSIS — M48061 Spinal stenosis, lumbar region without neurogenic claudication: Secondary | ICD-10-CM

## 2022-09-20 MED ORDER — ALPRAZOLAM 0.5 MG PO TABS
0.2500 mg | ORAL_TABLET | Freq: Every day | ORAL | 0 refills | Status: DC | PRN
Start: 2022-09-20 — End: 2023-10-21

## 2022-09-20 MED ORDER — VALACYCLOVIR HCL 1 G PO TABS
1000.0000 mg | ORAL_TABLET | Freq: Two times a day (BID) | ORAL | 3 refills | Status: AC
Start: 2022-09-20 — End: ?

## 2022-09-20 NOTE — Assessment & Plan Note (Signed)
Continue to use alprazolam sparingly.  Will go ahead and refill

## 2022-09-20 NOTE — Assessment & Plan Note (Signed)
Trazodone is working really well.  Happy with regimen no side effects.  She does struggle more right around the time of her cycle so we discussed maybe taking 1-1/2 tabs or even up to 2 for a few days right around that time.

## 2022-09-20 NOTE — Progress Notes (Signed)
Established Patient Office Visit  Subjective   Patient ID: Emily Herring, female    DOB: 09/26/1975  Age: 47 y.o. MRN: [id number]  Chief Complaint  Patient presents with   Medication Refill    HPI  F/U insomnia -  trazodone seems to be working really well. Taking it about 2 hours before bedtime.   F/U dep/anxiety  - uses xanax PRN. Has only taking 3-4 tabs in the last 60mo.   Been struggling with mid and low back pain more so on the right compared to the left.  She does see massage therapist monthly and chiropractor monthly but would like to do some further work on her back.  She is just feeling like she is starting to get a little worsening of her scoliosis as well and does not really want to take medication to manage her pain.  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    ROS    Objective:     BP 109/66 (BP Location: Left Arm, Patient Position: Sitting, Cuff Size: Normal)   Pulse (!) 54   Ht 5\' 10"  (1.778 m)   Wt 157 lb (71.2 kg)   SpO2 99%   BMI 22.53 kg/m    Physical Exam Vitals and nursing note reviewed.  Constitutional:      Appearance: She is well-developed.  HENT:     Head: Normocephalic and atraumatic.  Cardiovascular:     Rate and Rhythm: Normal rate and regular rhythm.     Heart sounds: Normal heart sounds.  Pulmonary:     Effort: Pulmonary effort is normal.     Breath sounds: Normal breath sounds.  Skin:    General: Skin is warm and dry.  Neurological:     Mental Status: She is alert and oriented to person, place, and time.  Psychiatric:        Behavior: Behavior normal.      Results for orders placed or performed in visit on 09/20/22  Cologuard  Result Value Ref Range   Cologuard Negative Negative      The 10-year ASCVD risk score (Arnett DK, et al., 2019) is: 0.5%    Assessment & Plan:   Problem List Items Addressed This Visit       Other   Spinal  stenosis of lumbar region    Will go ahead and place referral for formal PT.  Continue with massage therapy and chiropractic care as well.  She does have a prescription for Flexeril but says it just makes her feel loopy.      Primary insomnia    Trazodone is working really well.  Happy with regimen no side effects.  She does struggle more right around the time of her cycle so we discussed maybe taking 1-1/2 tabs or even up to 2 for a few days right around that time.      Herpes simplex virus (HSV) infection    Refill valacyclovir.  She thinks maybe the tomatoes she has been eating more recently has caused breakouts.  more acidic foods can trigger this as well as sun exposure.      Relevant Medications   valACYclovir (VALTREX) 1000 MG tablet   GAD (generalized anxiety disorder) - Primary    Continue to use alprazolam sparingly.  Will go ahead and refill      Relevant Medications   ALPRAZolam (XANAX) 0.5 MG tablet   DEPRESSION/ANXIETY  Relevant Medications   ALPRAZolam (XANAX) 0.5 MG tablet   Other Visit Diagnoses     Chronic bilateral low back pain, unspecified whether sciatica present       Relevant Orders   Ambulatory referral to Physical Therapy       Return in about 6 months (around 03/23/2023) for Mood/Sleep .    Nani Gasser, MD

## 2022-09-20 NOTE — Assessment & Plan Note (Signed)
Will go ahead and place referral for formal PT.  Continue with massage therapy and chiropractic care as well.  She does have a prescription for Flexeril but says it just makes her feel loopy.

## 2022-09-20 NOTE — Assessment & Plan Note (Signed)
Refill valacyclovir.  She thinks maybe the tomatoes she has been eating more recently has caused breakouts.  more acidic foods can trigger this as well as sun exposure.

## 2022-09-27 NOTE — Therapy (Unsigned)
OUTPATIENT PHYSICAL THERAPY THORACOLUMBAR EVALUATION   Patient Name: Emily Herring MRN: [id number] DOB:[birth year], 47 y.o., female Today's Date: 09/28/2022  END OF SESSION:  PT End of Session - 09/28/22 1514     Visit Number 1    Number of Visits 24    Date for PT Re-Evaluation 11/09/22    Authorization Type [employer] 1000 deductable; 70/30 coinsurance; no copay; 30 visits/year    PT Start Time 1315    PT Stop Time 1404    PT Time Calculation (min) 49 min    Activity Tolerance Patient tolerated treatment well             Past Medical History:  Diagnosis Date   H/O seasonal allergies    Infertility, female    Past Surgical History:  Procedure Laterality Date   ENDOMETRIAL ABLATION     LASIK Bilateral 05/13/11   Patient Active Problem List   Diagnosis Date Noted   Fibroid uterus 07/22/2022   Primary insomnia 03/22/2022   GAD (generalized anxiety disorder) 06/14/2019   Chronic daily headache 06/14/2019   Hyperprolactinemia (HCC) 05/23/2018   Idiopathic scoliosis 05/15/2014   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   Spinal stenosis of lumbar region 10/11/2013   Herpes simplex virus (HSV) infection 09/18/2010   DEPRESSION/ANXIETY 06/06/2008    PCP: Dr Nani Gasser  REFERRING PROVIDER: Dr Nani Gasser  REFERRING DIAG: Chronic LBP; lumbar spinal stenosis   Rationale for Evaluation and Treatment: Rehabilitation  THERAPY DIAG:  Other low back pain  Other symptoms and signs involving the musculoskeletal system  Abnormal posture  Chronic bilateral low back pain, unspecified whether sciatica present  ONSET DATE: 06/30/22  SUBJECTIVE:                                                                                                                                                                                           SUBJECTIVE STATEMENT: Patient reports that she has scoliosis but that has not bothered her. She had a fall ~ 10 years ago and struck her low back.  She was seen by PT with resolution of pain. Has noticed pain in the past 2 years. She awakens with pain and has pain with functional activities. She has managed symptoms with chiropractic care monthly to quarterly and massage. Symptoms will improve but then return. Feels the pain on the R side of the spine. Has noticed pain in the L knee. At times she has sciatic pain in the R LE   PERTINENT HISTORY:  Scoliosis; history of LBP   PAIN:  Are you having pain? Yes: NPRS scale: 4/10 Pain location: R lumbar and thoracic area Pain description: aching  Aggravating factors: anything; golfing, running will irritate symptoms for several days  Relieving factors: TENS unit; infrared therapy for light/heat; OTC antiinflammatory; ice  PRECAUTIONS: None  RED FLAGS: None   WEIGHT BEARING RESTRICTIONS: No  FALLS:  Has patient fallen in last 6 months? 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 No acute findings in the abdomen or pelvis. 2. Increase in size of a exophytic possibly pedunculated fibroid extending from the uterine fundus. It contains more internal cystic density which could denote cystic degeneration. This could reflect a source of pain in the appropriate clinical setting. 3. Small volume free fluid in the pelvis is likely physiologic.  PATIENT SURVEYS:  FOTO 57; goal 64  SCREENING FOR RED FLAGS: Bowel or bladder incontinence: No Spinal  tumors: No Cauda equina syndrome: No Compression fracture: No Abdominal aneurysm: No  COGNITION: Overall cognitive status: Within functional limits for tasks assessed     SENSATION: WFL  MUSCLE LENGTH: Hamstrings: WFL's bilat  Thomas test: WFL's bilat   POSTURE: rounded shoulders, forward head, decreased lumbar lordosis, increased thoracic kyphosis, and posterior pelvic tilt  PALPATION: Muscular tightness L QL; bilat lumbar paraspinals Tightness and discomfort with PA and lateral mobs Grade II/II through lumbar and lower thoracic spine  LUMBAR ROM:   AROM eval  Flexion 80%  Extension 70%  Right lateral flexion 80% discomfort R LB  Left lateral flexion 70% discomfort R LB   Right rotation 65%  Left rotation 55%   (Blank rows = not tested)  LOWER EXTREMITY ROM:   tight piriformis L   Active  Right eval Left eval  Hip flexion    Hip extension    Hip abduction    Hip adduction    Hip internal rotation    Hip external rotation    Knee flexion    Knee extension    Ankle dorsiflexion    Ankle plantarflexion    Ankle inversion    Ankle eversion     (Blank rows = not tested)  LOWER EXTREMITY MMT:  LE strength WFL's; weakness through core   MMT Right eval Left eval  Hip flexion    Hip extension    Hip abduction    Hip adduction    Hip internal rotation    Hip external rotation    Knee flexion    Knee extension    Ankle dorsiflexion    Ankle plantarflexion    Ankle inversion    Ankle eversion     (Blank rows = not tested)  LUMBAR SPECIAL TESTS:  Straight leg raise test: Negative and Slump test: Negative   GAIT: Distance walked: 40 feet Assistive device utilized: None Level of assistance: Complete Independence Comments: WFL's decreased lumbar lordosis and shoulders forward  with standing and walking  [facility] Adult PT Treatment:  DATE: 09/28/22 Therapeutic Exercise: Prone  Prone press up 3 sec x 8 Supine   4 part core 10 sec x 10  Piriformis stretch 30 sec x 3 L  Standing  Trunk extension 2-3 sec x 2  (Add pec stretch) Manual Therapy: Trial of DN R lumbar L QL Neuromuscular re-ed: Postural correction and education  Therapeutic Activity: Use of noodle for sitting  Modalities: Has TENs unit for home  Self Care: Postural correction; sleeping positions - to avoid sleeping with R hip in ER, knee flexed      PATIENT EDUCATION:  Education details: POC; HEP  Person educated: Patient Education method: Explanation, Demonstration, Tactile cues, Verbal cues, and Handouts Education comprehension: verbalized understanding, returned demonstration, verbal cues required, tactile cues required, and needs further education  HOME EXERCISE PROGRAM: Access Code: [email or web address] URL: [email or web address] Date: 09/28/2022 Prepared by: Corlis Leak  Exercises - Prone Press Up  - 2 x daily - 7 x weekly - 1 sets - 10 reps - 2-3 sec  hold - Standing Lumbar Extension  - 2 x daily - 7 x weekly - 1 sets - 2-3 reps - 2-3 sec  hold - Supine Transversus Abdominis Bracing with Pelvic Floor Contraction  - 2 x daily - 7 x weekly - 1 sets - 10 reps - 10sec  hold - Supine Piriformis Stretch with Leg Straight  - 2 x daily - 7 x weekly - 1 sets - 3 reps - 30 sec  hold  Patient Education - Hospital doctor - Office Posture - Trigger Point Dry Needling  ASSESSMENT:  CLINICAL IMPRESSION: Patient is a 47 y.o. female who was seen today for physical therapy evaluation and treatment for chronic LBP and lumbar spinal stenosis. She reports pain has been present for the past 2 years on a constant basis. Symptoms are increased with increased activity level and sometimes worse in the morning. Patient sleeps prone with R LE pulled up into hip and knee flexion with hip in ER. She has tightness in lumbar spine with R convex, L concave scoliotic curvature, R shoulder elevated compared to L. Emily Herring has weakness  though core and flexion bias in sitting and standing. Patient will benefit from PT to address problems identified.   OBJECTIVE IMPAIRMENTS: decreased ROM, decreased strength, increased fascial restrictions, increased muscle spasms, impaired flexibility, postural dysfunction, and pain.   ACTIVITY LIMITATIONS: carrying, lifting, bending, sitting, and sleeping  PARTICIPATION LIMITATIONS: cleaning, laundry, occupation, and yard work  PERSONAL FACTORS: Past/current experiences and Time since onset of injury/illness/exacerbation are also affecting patient's functional outcome.   REHAB POTENTIAL: Good  CLINICAL DECISION MAKING: Stable/uncomplicated  EVALUATION COMPLEXITY: Low   GOALS: Goals reviewed with patient? Yes  SHORT TERM GOALS: Target date: 11/09/2022  Independent in initial HEP  Baseline: Goal status: INITIAL  2.  Patient to report change in sleeping position and sitting posture to avoid spinal irritation  Baseline:  Goal status: INITIAL   LONG TERM GOALS: Target date: 12/21/2022   Improve spinal mobility with patient to demonstrate ROM WFL's throughout without discomfort in LB Baseline:  Goal status: INITIAL  2.  Improve core strength and stability with patient to exhibit good posture and alignment in sitting, standing, walking Baseline:  Goal status: INITIAL  3.  Decrease pain by 75-100% allowing patient to return to all normal functional and recreational activities with minimal to no increase in pain  Baseline:  Goal status: INITIAL  4.  Decreased palpable tightness R lumbar paraspinals, L QL  Baseline:  Goal status: INITIAL  5.  Independent in HEP including aquatic program as indicated  Baseline:  Goal status: INITIAL  6.  Improve functional limitation score to 71 Baseline: 57 Goal status: INITIAL  PLAN:  PT FREQUENCY: 2x/week  PT DURATION: 12 weeks  PLANNED INTERVENTIONS: Therapeutic exercises, Therapeutic activity, Neuromuscular re-education,  Balance training, Gait training, Patient/Family education, Self Care, Joint mobilization, Aquatic Therapy, Dry Needling, Electrical stimulation, Spinal mobilization, Cryotherapy, and Moist heat.  PLAN FOR NEXT SESSION: review and progress exercise; continue with spine care education; manual work, DN, modalities as indicated    [employer], PT 09/28/2022, 3:15 PM

## 2022-09-28 ENCOUNTER — Ambulatory Visit: Payer: 59 | Attending: Family Medicine | Admitting: Rehabilitative and Restorative Service Providers"

## 2022-09-28 ENCOUNTER — Encounter: Payer: Self-pay | Admitting: Rehabilitative and Restorative Service Providers"

## 2022-09-28 ENCOUNTER — Other Ambulatory Visit: Payer: Self-pay

## 2022-09-28 DIAGNOSIS — M5459 Other low back pain: Secondary | ICD-10-CM | POA: Diagnosis not present

## 2022-09-28 DIAGNOSIS — R293 Abnormal posture: Secondary | ICD-10-CM

## 2022-09-28 DIAGNOSIS — M545 Low back pain, unspecified: Secondary | ICD-10-CM

## 2022-09-28 DIAGNOSIS — R29898 Other symptoms and signs involving the musculoskeletal system: Secondary | ICD-10-CM

## 2022-09-28 DIAGNOSIS — G8929 Other chronic pain: Secondary | ICD-10-CM | POA: Insufficient documentation

## 2022-09-30 ENCOUNTER — Encounter: Payer: Self-pay | Admitting: Rehabilitative and Restorative Service Providers"

## 2022-09-30 ENCOUNTER — Other Ambulatory Visit: Payer: Self-pay | Admitting: Family Medicine

## 2022-09-30 ENCOUNTER — Ambulatory Visit: Payer: 59 | Attending: Family Medicine | Admitting: Rehabilitative and Restorative Service Providers"

## 2022-09-30 DIAGNOSIS — R29898 Other symptoms and signs involving the musculoskeletal system: Secondary | ICD-10-CM | POA: Insufficient documentation

## 2022-09-30 DIAGNOSIS — M545 Low back pain, unspecified: Secondary | ICD-10-CM | POA: Diagnosis not present

## 2022-09-30 DIAGNOSIS — M5459 Other low back pain: Secondary | ICD-10-CM | POA: Diagnosis not present

## 2022-09-30 DIAGNOSIS — G8929 Other chronic pain: Secondary | ICD-10-CM

## 2022-09-30 DIAGNOSIS — R293 Abnormal posture: Secondary | ICD-10-CM | POA: Insufficient documentation

## 2022-09-30 NOTE — Telephone Encounter (Signed)
Patient called she is requesting to try Robaxin for muscle pain  [facility] [address] [place] [address]  Phoen [phone]

## 2022-09-30 NOTE — Therapy (Signed)
OUTPATIENT PHYSICAL THERAPY THORACOLUMBAR TREATMENT   Patient Name: Emily Herring MRN: [id number] DOB:1975/06/03, 47 y.o., female Today's Date: 09/30/2022  END OF SESSION:  PT End of Session - 09/30/22 0930     Visit Number 2    Number of Visits 24    Date for PT Re-Evaluation 11/09/22    Authorization Type [employer] 1000 deductable; 70/30 coinsurance; no copay; 30 visits/year    PT Start Time 0930    PT Stop Time 1018    PT Time Calculation (min) 48 min    Activity Tolerance Patient tolerated treatment well             Past Medical History:  Diagnosis Date   H/O seasonal allergies    Infertility, female    Past Surgical History:  Procedure Laterality Date   ENDOMETRIAL ABLATION     LASIK Bilateral 05/13/11   Patient Active Problem List   Diagnosis Date Noted   Fibroid uterus 07/22/2022   Primary insomnia 03/22/2022   GAD (generalized anxiety disorder) 06/14/2019   Chronic daily headache 06/14/2019   Hyperprolactinemia (HCC) 05/23/2018   Idiopathic scoliosis 05/15/2014   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   Spinal stenosis of lumbar region 10/11/2013   Herpes simplex virus (HSV) infection 09/18/2010   DEPRESSION/ANXIETY 06/06/2008    PCP: Dr Nani Gasser  REFERRING PROVIDER: Dr Nani Gasser  REFERRING DIAG: Chronic LBP; lumbar spinal stenosis   Rationale for Evaluation and Treatment: Rehabilitation  THERAPY DIAG:  Other low back pain  Other symptoms and signs involving the musculoskeletal system  Abnormal posture  Chronic bilateral low back pain, unspecified whether sciatica present  ONSET DATE: 06/30/22  SUBJECTIVE:                                                                                                                                                                                           SUBJECTIVE STATEMENT: Patient reports that the exercises are going well. She can feel her core working. Sleeping on her sides without problems and  thinks that has helped. Generally feeling better today.   Eval: Patient reports that she has scoliosis but that has not bothered her. She had a fall ~ 10 years ago and struck her low back. She was seen by PT with resolution of pain. Has noticed pain in the past 2 years. She awakens with pain and has pain with functional activities. She has managed symptoms with chiropractic care monthly to quarterly and massage. Symptoms will improve but then return. Feels the pain on the R side of the spine. Has noticed pain in the L knee. At times she has sciatic pain in the R  LE   PERTINENT HISTORY:  Scoliosis; history of LBP   PAIN:  Are you having pain? Yes: NPRS scale: 4/10 Pain location: R lumbar and thoracic area Pain description: aching  Aggravating factors: anything; golfing, running will irritate symptoms for several days  Relieving factors: TENS unit; infrared therapy for light/heat; OTC antiinflammatory; ice  PRECAUTIONS: None  WEIGHT BEARING RESTRICTIONS: No  FALLS:  Has patient fallen in last 6 months? 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 No acute findings in the abdomen or pelvis. 2. Increase in size of a exophytic possibly pedunculated fibroid extending from the uterine fundus. It contains more internal cystic density which could denote cystic degeneration. This could reflect a source of pain in the appropriate clinical setting. 3. Small volume free fluid in the pelvis is likely physiologic.  PATIENT SURVEYS:  FOTO 57; goal  71  MUSCLE LENGTH: Hamstrings: WFL's bilat  Thomas test: WFL's bilat   POSTURE: rounded shoulders, forward head, decreased lumbar lordosis, increased thoracic kyphosis, and posterior pelvic tilt  PALPATION: Muscular tightness L QL; bilat lumbar paraspinals Tightness and discomfort with PA and lateral mobs Grade II/II through lumbar and lower thoracic spine  LUMBAR ROM:   AROM eval  Flexion 80%  Extension 70%  Right lateral flexion 80% discomfort R LB  Left lateral flexion 70% discomfort R LB   Right rotation 65%  Left rotation 55%   (Blank rows = not tested)  LOWER EXTREMITY ROM:   tight piriformis L   Active  Right eval Left eval  Hip flexion    Hip extension    Hip abduction    Hip adduction    Hip internal rotation    Hip external rotation    Knee flexion    Knee extension    Ankle dorsiflexion    Ankle plantarflexion    Ankle inversion    Ankle eversion     (Blank rows = not tested)  LOWER EXTREMITY MMT:  LE strength WFL's; weakness through core   MMT Right eval Left eval  Hip flexion    Hip extension    Hip abduction    Hip adduction    Hip internal rotation    Hip external rotation    Knee flexion    Knee extension    Ankle dorsiflexion    Ankle plantarflexion    Ankle inversion    Ankle eversion     (Blank rows = not tested)  LUMBAR SPECIAL TESTS:  Straight leg raise test: Negative and Slump test: Negative   GAIT: Distance walked: 40 feet Assistive device utilized: None Level of assistance: Complete Independence Comments: WFL's decreased lumbar lordosis and shoulders forward  with standing and walking  [facility] Adult PT Treatment:                                                DATE: 09/30/22 Therapeutic Exercise: Prone  Prone press up 3  sec x 8 Supine  4 part core 10 sec x 10  Piriformis stretch 30 sec x 3 L  Sitting  Sit to stand core engaged x 10 Standing  Wall squat 10 sec hold x 5 core engaged back straight  Row blue TB x 10   Shoulder extension blue TB x 10  Antirotation 1 strip blue TB 3 sec x 10 R/L   Doorway stretch 3 positions 30 sec x 2  Shoulder flexion in doorway 30 sec x 2  Manual Therapy: Skilled palpation to assess response to assess response soft tissue and DN R lumbar L QL Trigger Point Dry-Needling  Treatment instructions: Expect mild to moderate muscle soreness. S/S of pneumothorax if dry needled over a lung field, and to seek immediate medical attention should they occur. Patient verbalized understanding of these instructions and education.  Patient Consent Given: Yes Education handout provided: Previously provided Muscles treated: R lumbar L QL Electrical stimulation performed: Yes Parameters: mAmp current intensity to pt tolerance  Treatment response/outcome: decreased palpable tightness   Neuromuscular re-ed: Postural correction and education  Therapeutic Activity: Use of noodle for sitting  Modalities: Has TENs unit for home  Self Care: Postural correction; sleeping positions - to avoid sleeping with R hip in ER, knee flexed   [facility] Adult PT Treatment:                                                DATE: 09/28/22 Therapeutic Exercise: Prone  Prone press up 3 sec x 8 Supine  4 part core 10 sec x 10  Piriformis stretch 30 sec x 3 L  Standing  Trunk extension 2-3 sec x 2  (Add pec stretch) Manual Therapy: Trial of DN R lumbar L QL Neuromuscular re-ed: Postural correction and education  Therapeutic Activity: Use of noodle for sitting  Modalities: Has TENs unit for home  Self Care: Postural correction; sleeping positions - to avoid sleeping with R hip in ER, knee flexed      PATIENT EDUCATION:  Education details: POC; HEP  Person educated: Patient Education method: Explanation, Demonstration, Tactile cues, Verbal cues, and Handouts Education comprehension: verbalized understanding, returned demonstration, verbal cues required, tactile cues required, and needs further  education  HOME EXERCISE PROGRAM: Access Code: [email or web address] URL: [email or web address] Date: 09/30/2022 Prepared by: Corlis Leak  Exercises - Prone Press Up  - 2 x daily - 7 x weekly - 1 sets - 10 reps - 2-3 sec  hold - Standing Lumbar Extension  - 2 x daily - 7 x weekly - 1 sets - 2-3 reps - 2-3 sec  hold - Supine Transversus Abdominis Bracing with Pelvic Floor Contraction  - 2 x daily - 7 x weekly - 1 sets - 10 reps - 10sec  hold - Supine Piriformis Stretch with Leg Straight  - 2 x daily - 7 x weekly - 1 sets - 3 reps - 30 sec  hold - Sit to Stand  - 2 x daily - 7 x weekly - 1 sets - 10 reps - 3-5 sec  hold - Wall Quarter Squat  - 2 x daily - 7 x weekly - 1-2 sets - 10 reps - 10 sec  hold - Standing Bilateral Low Shoulder Row with Anchored Resistance  - 2 x daily - 7 x weekly - 1-3 sets - 10 reps - 2-3  sec  hold - Shoulder extension with resistance - Neutral  - 1 x daily - 7 x weekly - 1-2 sets - 10 reps - 3-5 sec  hold - Anti-Rotation Lateral Stepping with Press  - 2 x daily - 7 x weekly - 1-2 sets - 10 reps - 2-3 sec  hold - Doorway Pec Stretch at 60 Degrees Abduction  - 3 x daily - 7 x weekly - 1 sets - 3 reps - Doorway Pec Stretch at 90 Degrees Abduction  - 3 x daily - 7 x weekly - 1 sets - 3 reps - 30 seconds  hold - Doorway Pec Stretch at 120 Degrees Abduction  - 3 x daily - 7 x weekly - 1 sets - 3 reps - 30 second hold  hold  Patient Education - Hospital doctor - Office Posture - Trigger Point Dry Needling  ASSESSMENT:  CLINICAL IMPRESSION: Good response to the initial treatment. Tolerated DN and manual work well with decreased palpable tightness noted.    Eval: Patient is a 46 y.o. female who was seen today for physical therapy evaluation and treatment for chronic LBP and lumbar spinal stenosis. She reports pain has been present for the past 2 years on a constant basis. Symptoms are increased with increased activity level and sometimes worse in the  morning. Patient sleeps prone with R LE pulled up into hip and knee flexion with hip in ER. She has tightness in lumbar spine with R convex, L concave scoliotic curvature, R shoulder elevated compared to L. Kaylii has weakness though core and flexion bias in sitting and standing. Patient will benefit from PT to address problems identified.    GOALS: Goals reviewed with patient? Yes  SHORT TERM GOALS: Target date: 11/09/2022  Independent in initial HEP  Baseline: Goal status: INITIAL  2.  Patient to report change in sleeping position and sitting posture to avoid spinal irritation  Baseline:  Goal status: INITIAL   LONG TERM GOALS: Target date: 12/21/2022   Improve spinal mobility with patient to demonstrate ROM WFL's throughout without discomfort in LB Baseline:  Goal status: INITIAL  2.  Improve core strength and stability with patient to exhibit good posture and alignment in sitting, standing, walking Baseline:  Goal status: INITIAL  3.  Decrease pain by 75-100% allowing patient to return to all normal functional and recreational activities with minimal to no increase in pain  Baseline:  Goal status: INITIAL  4.  Decreased palpable tightness R lumbar paraspinals, L QL  Baseline:  Goal status: INITIAL  5.  Independent in HEP including aquatic program as indicated  Baseline:  Goal status: INITIAL  6.  Improve functional limitation score to 71 Baseline: 57 Goal status: INITIAL  PLAN:  PT FREQUENCY: 2x/week  PT DURATION: 12 weeks  PLANNED INTERVENTIONS: Therapeutic exercises, Therapeutic activity, Neuromuscular re-education, Balance training, Gait training, Patient/Family education, Self Care, Joint mobilization, Aquatic Therapy, Dry Needling, Electrical stimulation, Spinal mobilization, Cryotherapy, and Moist heat.  PLAN FOR NEXT SESSION: review and progress exercise; continue with spine care education; manual work, DN, modalities as indicated    Parnika Tweten Rober Minion,  PT 09/30/2022, 9:32 AM

## 2022-10-04 ENCOUNTER — Encounter: Payer: 59 | Admitting: Rehabilitative and Restorative Service Providers"

## 2022-10-06 MED ORDER — METHOCARBAMOL 500 MG PO TABS
500.0000 mg | ORAL_TABLET | Freq: Three times a day (TID) | ORAL | 1 refills | Status: DC | PRN
Start: 2022-10-06 — End: 2023-03-22

## 2022-10-06 NOTE — Telephone Encounter (Signed)
Pt requesting Robaxin. She currently takes Flexeril however causes sedation. She has never taken Robaxin.

## 2022-10-07 NOTE — Telephone Encounter (Signed)
Called and advised pt of medication being sent to her pharmacy

## 2022-10-08 ENCOUNTER — Ambulatory Visit: Payer: 59

## 2022-10-08 DIAGNOSIS — R293 Abnormal posture: Secondary | ICD-10-CM | POA: Diagnosis not present

## 2022-10-08 DIAGNOSIS — G8929 Other chronic pain: Secondary | ICD-10-CM | POA: Diagnosis not present

## 2022-10-08 DIAGNOSIS — R29898 Other symptoms and signs involving the musculoskeletal system: Secondary | ICD-10-CM | POA: Diagnosis not present

## 2022-10-08 DIAGNOSIS — M5459 Other low back pain: Secondary | ICD-10-CM

## 2022-10-08 DIAGNOSIS — M545 Low back pain, unspecified: Secondary | ICD-10-CM

## 2022-10-08 NOTE — Therapy (Addendum)
OUTPATIENT PHYSICAL THERAPY THORACOLUMBAR TREATMENT   Patient Name: Emily Herring MRN: [id number] DOB:1975-09-05, 47 y.o., female Today's Date: 10/08/2022  END OF SESSION:  PT End of Session - 10/08/22 0845     Visit Number 3    Number of Visits 24    Date for PT Re-Evaluation 11/09/22    Authorization Type [employer] 1000 deductable; 70/30 coinsurance; no copay; 30 visits/year    PT Start Time 0845    PT Stop Time 0930    PT Time Calculation (min) 45 min    Activity Tolerance Patient tolerated treatment well    Behavior During Therapy 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   Spinal stenosis of lumbar region 10/11/2013   Herpes simplex virus (HSV) infection 09/18/2010   DEPRESSION/ANXIETY 06/06/2008    PCP: Dr Nani Gasser  REFERRING PROVIDER: Dr Nani Gasser  REFERRING DIAG: Chronic LBP; lumbar spinal stenosis   Rationale for Evaluation and Treatment: Rehabilitation  THERAPY DIAG:  Other low back pain  Other symptoms and signs involving the musculoskeletal system  Abnormal posture  Chronic bilateral low back pain, unspecified whether sciatica present  ONSET DATE: 06/30/22  SUBJECTIVE:                                                                                                                                                                                           SUBJECTIVE STATEMENT: Patient reports PCP prescribed raloxifene which has helped a lot  with back pain. Patient states her back is tight from past back spasms. Patient states she the R side of her back becomes stiff when sitting.   Eval: Patient reports that she has scoliosis but that has not bothered her. She had a fall ~ 10 years ago and struck her low back. She was seen by PT with resolution of pain. Has noticed pain in the past 2 years. She awakens with pain and has pain with functional activities. She has managed symptoms with chiropractic care monthly to quarterly and massage. Symptoms will improve but then return. Feels the pain on the R side of the  spine. Has noticed pain in the L knee. At times she has sciatic pain in the R LE   PERTINENT HISTORY:  Scoliosis; history of LBP   PAIN:  Are you having pain? Yes: NPRS scale: 4/10 Pain location: R lumbar and thoracic area Pain description: aching  Aggravating factors: anything; golfing, running will irritate symptoms for several days  Relieving factors: TENS unit; infrared therapy for light/heat; OTC antiinflammatory; ice  PRECAUTIONS: None  WEIGHT BEARING RESTRICTIONS: No  FALLS:  Has patient fallen in last 6 months? 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 No acute findings in the abdomen or pelvis. 2. Increase in size of a exophytic possibly pedunculated fibroid extending from the uterine fundus. It contains more internal cystic density which could denote cystic degeneration. This could reflect a source of pain in the appropriate clinical setting. 3. Small volume free  fluid in the pelvis is likely physiologic.  PATIENT SURVEYS:  FOTO 57; goal 71  MUSCLE LENGTH: Hamstrings: WFL's bilat  Thomas test: WFL's bilat   POSTURE: rounded shoulders, forward head, decreased lumbar lordosis, increased thoracic kyphosis, and posterior pelvic tilt  PALPATION: Muscular tightness L QL; bilat lumbar paraspinals Tightness and discomfort with PA and lateral mobs Grade II/II through lumbar and lower thoracic spine  LUMBAR ROM:   AROM eval  Flexion 80%  Extension 70%  Right lateral flexion 80% discomfort R LB  Left lateral flexion 70% discomfort R LB   Right rotation 65%  Left rotation 55%   (Blank rows = not tested)  LOWER EXTREMITY ROM:   tight piriformis L   Active  Right eval Left eval  Hip flexion    Hip extension    Hip abduction    Hip adduction    Hip internal rotation    Hip external rotation    Knee flexion    Knee extension    Ankle dorsiflexion    Ankle plantarflexion    Ankle inversion    Ankle eversion     (Blank rows = not tested)  LOWER EXTREMITY MMT:  LE strength WFL's; weakness through core   MMT Right eval Left eval  Hip flexion    Hip extension    Hip abduction    Hip adduction    Hip internal rotation    Hip external rotation    Knee flexion    Knee extension    Ankle dorsiflexion    Ankle plantarflexion    Ankle inversion    Ankle eversion     (Blank rows = not tested)  LUMBAR SPECIAL TESTS:  Straight leg raise test: Negative and Slump test: Negative   GAIT: Distance walked: 40 feet Assistive device utilized: None Level of assistance: Complete Independence Comments: WFL's decreased lumbar lordosis and shoulders forward  with standing and walking  [facility] Adult PT Treatment:  DATE: 10/08/2022 Therapeutic Exercise: Treadmill warm-up 3.0, 0% incline x Seated green PB roll out (folded towel underneath R glute) 10x5" R S/L (towel underneath pelvis for  elongation) hip abd straight leg raises 2x10 (foot on block) R S/L hip extension in abduction 2x10 Quadruped: L straight leg hip extension Resisted arm raises in scaption YTB x10 (B) --> added opposite leg extension x10 (B) Manual Therapy: Trigger Point Dry-Needling  Treatment instructions: Expect mild to moderate muscle soreness. S/S of pneumothorax if dry needled over a lung field, and to seek immediate medical attention should they occur. Patient verbalized understanding of these instructions and education.  Patient Consent Given: Yes Education handout provided: Previously provided Muscles treated: Bilat QL, lumbar paraspinals Electrical stimulation performed: No Parameters: N/A Treatment response/outcome: decreased palpable tightness  Self Care: Sitting with folded towel underneath R glute; towels underneath R hip with side lying exercises   [facility] Adult PT Treatment:                                                DATE: 09/30/22 Therapeutic Exercise: Prone  Prone press up 3 sec x 8 Supine  4 part core 10 sec x 10  Piriformis stretch 30 sec x 3 L  Sitting  Sit to stand core engaged x 10 Standing  Wall squat 10 sec hold x 5 core engaged back straight  Row blue TB x 10  Shoulder extension blue TB x 10  Antirotation 1 strip blue TB 3 sec x 10 R/L   Doorway stretch 3 positions 30 sec x 2  Shoulder flexion in doorway 30 sec x 2  Manual Therapy: Skilled palpation to assess response to assess response soft tissue and DN R lumbar L QL Trigger Point Dry-Needling  Treatment instructions: Expect mild to moderate muscle soreness. S/S of pneumothorax if dry needled over a lung field, and to seek immediate medical attention should they occur. Patient verbalized understanding of these instructions and education.  Patient Consent Given: Yes Education handout provided: Previously provided Muscles treated: R lumbar L QL Electrical stimulation performed: Yes Parameters: mAmp current  intensity to pt tolerance  Treatment response/outcome: decreased palpable tightness   Neuromuscular re-ed: Postural correction and education  Therapeutic Activity: Use of noodle for sitting  Modalities: Has TENs unit for home  Self Care: Postural correction; sleeping positions - to avoid sleeping with R hip in ER, knee flexed   [facility] Adult PT Treatment:                                                DATE: 09/28/22 Therapeutic Exercise: Prone  Prone press up 3 sec x 8 Supine  4 part core 10 sec x 10  Piriformis stretch 30 sec x 3 L  Standing  Trunk extension 2-3 sec x 2  (Add pec stretch) Manual Therapy: Trial of DN R lumbar L QL Neuromuscular re-ed: Postural correction and education  Therapeutic Activity: Use of noodle for sitting  Modalities: Has TENs unit for home  Self Care: Postural correction; sleeping positions - to avoid sleeping with R hip in ER, knee flexed      PATIENT EDUCATION:  Education details: POC; HEP  Person educated: Patient Education method: Explanation, Demonstration, Actor  cues, Verbal cues, and Handouts Education comprehension: verbalized understanding, returned demonstration, verbal cues required, tactile cues required, and needs further education  HOME EXERCISE PROGRAM: Access Code: [email or web address] URL: [email or web address] Date: 10/08/2022 Prepared by: Carlynn Herald  Exercises - Prone Press Up  - 2 x daily - 7 x weekly - 1 sets - 10 reps - 2-3 sec  hold - Standing Lumbar Extension  - 2 x daily - 7 x weekly - 1 sets - 2-3 reps - 2-3 sec  hold - Supine Transversus Abdominis Bracing with Pelvic Floor Contraction  - 2 x daily - 7 x weekly - 1 sets - 10 reps - 10sec  hold - Supine Piriformis Stretch with Leg Straight  - 2 x daily - 7 x weekly - 1 sets - 3 reps - 30 sec  hold - Sit to Stand  - 2 x daily - 7 x weekly - 1 sets - 10 reps - 3-5 sec  hold - Wall Quarter Squat  - 2 x daily - 7 x weekly - 1-2 sets - 10 reps - 10 sec  hold -  Standing Bilateral Low Shoulder Row with Anchored Resistance  - 2 x daily - 7 x weekly - 1-3 sets - 10 reps - 2-3 sec  hold - Shoulder extension with resistance - Neutral  - 1 x daily - 7 x weekly - 1-2 sets - 10 reps - 3-5 sec  hold - Anti-Rotation Lateral Stepping with Press  - 2 x daily - 7 x weekly - 1-2 sets - 10 reps - 2-3 sec  hold - Doorway Pec Stretch at 60 Degrees Abduction  - 3 x daily - 7 x weekly - 1 sets - 3 reps - Doorway Pec Stretch at 90 Degrees Abduction  - 3 x daily - 7 x weekly - 1 sets - 3 reps - 30 seconds  hold - Doorway Pec Stretch at 120 Degrees Abduction  - 3 x daily - 7 x weekly - 1 sets - 3 reps - 30 second hold  hold - Sidelying Hip Abduction  - 1 x daily - 7 x weekly - 3 sets - 10 reps - Sidelying Hip Extension in Abduction  - 1 x daily - 7 x weekly - 3 sets - 10 reps - Seated Flexion Stretch with Swiss Ball  - 1 x daily - 7 x weekly - 3 sets - 10 reps - Quadruped Bird Dog with Arm and Leg Resistance  - 1 x daily - 7 x weekly - 3 sets - 10 reps  Patient Education - Hospital doctor - Office Posture - Trigger Point Dry Needling  ASSESSMENT:  CLINICAL IMPRESSION: Placing folded towel underneath R glute in sitting promoted elongation on L trunk and pelvis; patient reported deeper stretch along thoracolumbar musculature during seated forward stretches. Hip strengthening and postural exercises continued; patient demonstrated good implementation of verbal and tactile cues for alignment.    Eval: Patient is a 47 y.o. female who was seen today for physical therapy evaluation and treatment for chronic LBP and lumbar spinal stenosis. She reports pain has been present for the past 2 years on a constant basis. Symptoms are increased with increased activity level and sometimes worse in the morning. Patient sleeps prone with R LE pulled up into hip and knee flexion with hip in ER. She has tightness in lumbar spine with R convex, L concave scoliotic curvature, R  shoulder elevated compared to L. Durelle has weakness though core and  flexion bias in sitting and standing. Patient will benefit from PT to address problems identified.    GOALS: Goals reviewed with patient? Yes  SHORT TERM GOALS: Target date: 11/09/2022  Independent in initial HEP  Baseline: Goal status: INITIAL  2.  Patient to report change in sleeping position and sitting posture to avoid spinal irritation  Baseline:  Goal status: INITIAL   LONG TERM GOALS: Target date: 12/21/2022  Improve spinal mobility with patient to demonstrate ROM WFL's throughout without discomfort in LB Baseline:  Goal status: INITIAL  2.  Improve core strength and stability with patient to exhibit good posture and alignment in sitting, standing, walking Baseline:  Goal status: INITIAL  3.  Decrease pain by 75-100% allowing patient to return to all normal functional and recreational activities with minimal to no increase in pain  Baseline:  Goal status: INITIAL  4.  Decreased palpable tightness R lumbar paraspinals, L QL  Baseline:  Goal status: INITIAL  5.  Independent in HEP including aquatic program as indicated  Baseline:  Goal status: INITIAL  6.  Improve functional limitation score to 71 Baseline: 57 Goal status: INITIAL  PLAN:  PT FREQUENCY: 2x/week  PT DURATION: 12 weeks  PLANNED INTERVENTIONS: Therapeutic exercises, Therapeutic activity, Neuromuscular re-education, Balance training, Gait training, Patient/Family education, Self Care, Joint mobilization, Aquatic Therapy, Dry Needling, Electrical stimulation, Spinal mobilization, Cryotherapy, and Moist heat.  PLAN FOR NEXT SESSION: review and progress exercise; continue with spine care education; manual work, DN, modalities as indicated    Reggy Eye, PT,DPT08/10/2409:23 AM   Sanjuana Mae, PTA 10/08/2022, 9:31 AM

## 2022-10-09 ENCOUNTER — Other Ambulatory Visit: Payer: Self-pay | Admitting: Family Medicine

## 2022-10-09 DIAGNOSIS — F5101 Primary insomnia: Secondary | ICD-10-CM

## 2022-10-15 ENCOUNTER — Ambulatory Visit: Payer: 59

## 2022-10-15 DIAGNOSIS — R29898 Other symptoms and signs involving the musculoskeletal system: Secondary | ICD-10-CM | POA: Diagnosis not present

## 2022-10-15 DIAGNOSIS — M5459 Other low back pain: Secondary | ICD-10-CM

## 2022-10-15 DIAGNOSIS — R293 Abnormal posture: Secondary | ICD-10-CM

## 2022-10-15 DIAGNOSIS — M545 Low back pain, unspecified: Secondary | ICD-10-CM | POA: Diagnosis not present

## 2022-10-15 DIAGNOSIS — G8929 Other chronic pain: Secondary | ICD-10-CM | POA: Diagnosis not present

## 2022-10-15 NOTE — Therapy (Addendum)
OUTPATIENT PHYSICAL THERAPY THORACOLUMBAR TREATMENT AND DISCHARGE SUMMARY  PHYSICAL THERAPY DISCHARGE SUMMARY  Visits from Start of Care: 4  Current functional level related to goals / functional outcomes: See progress note for discharge status    Remaining deficits: Unknown    Education / Equipment: HEP    Patient agrees to discharge. Patient goals were not met. Patient is being discharged due to not returning since the last visit.  Celyn P. Leonor Liv PT, MPH 01/20/23 3:59 PM   Patient Name: Emily Herring MRN: [id number] DOB:02/20/1976, 47 y.o., female Today's Date: 10/15/2022  END OF SESSION:  PT End of Session - 10/15/22 0803     Visit Number 4    Number of Visits 24    Date for PT Re-Evaluation 11/09/22    Authorization Type [employer] 1000 deductable; 70/30 coinsurance; no copay; 30 visits/year    PT Start Time 0800    PT Stop Time 0838    PT Time Calculation (min) 38 min    Activity Tolerance Patient tolerated treatment well    Behavior During Therapy [facility] for tasks assessed/performed             Past Medical History:  Diagnosis Date   H/O seasonal allergies    Infertility, female    Past Surgical History:  Procedure Laterality Date   ENDOMETRIAL ABLATION     LASIK Bilateral 05/13/11   Patient Active Problem List   Diagnosis Date Noted   Fibroid uterus 07/22/2022   Primary insomnia 03/22/2022   GAD (generalized anxiety disorder) 06/14/2019   Chronic daily headache 06/14/2019   Hyperprolactinemia (HCC) 05/23/2018   Idiopathic scoliosis 05/15/2014   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   Spinal stenosis of lumbar region 10/11/2013   Herpes simplex virus (HSV) infection 09/18/2010   DEPRESSION/ANXIETY 06/06/2008    PCP: Dr Nani Gasser  REFERRING PROVIDER: Dr Nani Gasser  REFERRING DIAG: Chronic LBP; lumbar spinal stenosis   Rationale for Evaluation and Treatment: Rehabilitation  THERAPY DIAG:  Other low back pain  Other symptoms and signs  involving the musculoskeletal system  Abnormal posture  Chronic bilateral low back pain, unspecified whether sciatica present  ONSET DATE: 06/30/22  SUBJECTIVE:                                                                                                                                                                                           SUBJECTIVE STATEMENT: Patient reports she had spasms along R side of back, states she thinks it was related to DN and wants to hold off on those treatments. Patient states she she has 4/10 pain in lower back on R side. Patient states  her core is feeling stronger; states she sees her chiropractor after PT today.   PERTINENT HISTORY:  Scoliosis; history of LBP   PAIN:  Are you having pain? Yes: NPRS scale: 4/10 Pain location: R lumbar and thoracic area Pain description: aching  Aggravating factors: anything; golfing, running will irritate symptoms for several days  Relieving factors: TENS unit; infrared therapy for light/heat; OTC antiinflammatory; ice  PRECAUTIONS: None  WEIGHT BEARING RESTRICTIONS: No  FALLS:  Has patient fallen in last 6 months? 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 No acute findings in the abdomen or pelvis. 2. Increase in size of a exophytic possibly pedunculated fibroid extending from the uterine fundus. It contains more internal cystic density which could denote cystic degeneration. This could reflect a source of pain in the appropriate clinical setting. 3. Small  volume free fluid in the pelvis is likely physiologic.  PATIENT SURVEYS:  FOTO 57; goal 71  MUSCLE LENGTH: Hamstrings: WFL's bilat  Thomas test: WFL's bilat   POSTURE: rounded shoulders, forward head, decreased lumbar lordosis, increased thoracic kyphosis, and posterior pelvic tilt  PALPATION: Muscular tightness L QL; bilat lumbar paraspinals Tightness and discomfort with PA and lateral mobs Grade II/II through lumbar and lower thoracic spine  LUMBAR ROM:   AROM eval  Flexion 80%  Extension 70%  Right lateral flexion 80% discomfort R LB  Left lateral flexion 70% discomfort R LB   Right rotation 65%  Left rotation 55%   (Blank rows = not tested)  LOWER EXTREMITY ROM:   tight piriformis L   Active  Right eval Left eval  Hip flexion    Hip extension    Hip abduction    Hip adduction    Hip internal rotation    Hip external rotation    Knee flexion    Knee extension    Ankle dorsiflexion    Ankle plantarflexion    Ankle inversion    Ankle eversion     (Blank rows = not tested)  LOWER EXTREMITY MMT:  LE strength WFL's; weakness through core   MMT Right eval Left eval  Hip flexion    Hip extension    Hip abduction    Hip adduction    Hip internal rotation    Hip external rotation    Knee flexion    Knee extension    Ankle dorsiflexion    Ankle plantarflexion    Ankle inversion    Ankle eversion     (Blank rows = not tested)  LUMBAR SPECIAL TESTS:  Straight leg raise test: Negative and Slump test: Negative   GAIT: Distance walked: 40 feet Assistive device utilized: None Level of assistance: Complete Independence Comments: WFL's decreased lumbar lordosis and shoulders forward  with standing and walking  [facility] Adult PT Treatment:  DATE: 10/15/2022 Therapeutic Exercise: Treadmill warm-up 3.0 mph, 0% incline x Cat/cow Seated green PB roll out front (folded towel underneath R glute) S/L hip abd  straight leg raises x10 --> hip ext in abd x10 --> arcs 2x10 (B) Prone:  frog legs heel squeeze 10x5" L bent knee hip ext x10 L hip IR/ER AROM  Quadruped: Arm raises YTB scaption & extension x10 each (B) Bird dog + YTB UE x15 (B) Dolphin tail stretch (gait belt & green PB)     [facility] Adult PT Treatment:                                                DATE: 10/08/2022 Therapeutic Exercise: Treadmill warm-up 3.0, 0% incline x Seated green PB roll out (folded towel underneath R glute) 10x5" R S/L (towel underneath pelvis for elongation) hip abd straight leg raises 2x10 (foot on block) R S/L hip extension in abduction 2x10 Quadruped: L straight leg hip extension Resisted arm raises in scaption YTB x10 (B) --> added opposite leg extension x10 (B) Manual Therapy: Trigger Point Dry-Needling  Treatment instructions: Expect mild to moderate muscle soreness. S/S of pneumothorax if dry needled over a lung field, and to seek immediate medical attention should they occur. Patient verbalized understanding of these instructions and education.  Patient Consent Given: Yes Education handout provided: Previously provided Muscles treated: Bilat QL, lumbar paraspinals Electrical stimulation performed: No Parameters: N/A Treatment response/outcome: decreased palpable tightness Self Care: Sitting with folded towel underneath R glute; towels underneath R hip with side lying exercises   [facility] Adult PT Treatment:                                                DATE: 09/30/22 Therapeutic Exercise: Prone  Prone press up 3 sec x 8 Supine  4 part core 10 sec x 10  Piriformis stretch 30 sec x 3 L  Sitting  Sit to stand core engaged x 10 Standing  Wall squat 10 sec hold x 5 core engaged back straight  Row blue TB x 10  Shoulder extension blue TB x 10  Antirotation 1 strip blue TB 3 sec x 10 R/L   Doorway stretch 3 positions 30 sec x 2  Shoulder flexion in doorway 30 sec x 2  Manual Therapy: Skilled  palpation to assess response to assess response soft tissue and DN R lumbar L QL Trigger Point Dry-Needling  Treatment instructions: Expect mild to moderate muscle soreness. S/S of pneumothorax if dry needled over a lung field, and to seek immediate medical attention should they occur. Patient verbalized understanding of these instructions and education.  Patient Consent Given: Yes Education handout provided: Previously provided Muscles treated: R lumbar L QL Electrical stimulation performed: Yes Parameters: mAmp current intensity to pt tolerance  Treatment response/outcome: decreased palpable tightness   Neuromuscular re-ed: Postural correction and education  Therapeutic Activity: Use of noodle for sitting  Modalities: Has TENs unit for home  Self Care: Postural correction; sleeping positions - to avoid sleeping with R hip in ER, knee flexed    PATIENT EDUCATION:  Education details: POC; HEP  Person educated: Patient Education method: Explanation, Demonstration, Tactile cues, Verbal cues, and Handouts Education comprehension: verbalized  understanding, returned demonstration, verbal cues required, tactile cues required, and needs further education  HOME EXERCISE PROGRAM: Access Code: [email or web address] URL: [email or web address] Date: 10/08/2022 Prepared by: Carlynn Herald  Exercises - Prone Press Up  - 2 x daily - 7 x weekly - 1 sets - 10 reps - 2-3 sec  hold - Standing Lumbar Extension  - 2 x daily - 7 x weekly - 1 sets - 2-3 reps - 2-3 sec  hold - Supine Transversus Abdominis Bracing with Pelvic Floor Contraction  - 2 x daily - 7 x weekly - 1 sets - 10 reps - 10sec  hold - Supine Piriformis Stretch with Leg Straight  - 2 x daily - 7 x weekly - 1 sets - 3 reps - 30 sec  hold - Sit to Stand  - 2 x daily - 7 x weekly - 1 sets - 10 reps - 3-5 sec  hold - Wall Quarter Squat  - 2 x daily - 7 x weekly - 1-2 sets - 10 reps - 10 sec  hold - Standing Bilateral Low Shoulder Row with  Anchored Resistance  - 2 x daily - 7 x weekly - 1-3 sets - 10 reps - 2-3 sec  hold - Shoulder extension with resistance - Neutral  - 1 x daily - 7 x weekly - 1-2 sets - 10 reps - 3-5 sec  hold - Anti-Rotation Lateral Stepping with Press  - 2 x daily - 7 x weekly - 1-2 sets - 10 reps - 2-3 sec  hold - Doorway Pec Stretch at 60 Degrees Abduction  - 3 x daily - 7 x weekly - 1 sets - 3 reps - Doorway Pec Stretch at 90 Degrees Abduction  - 3 x daily - 7 x weekly - 1 sets - 3 reps - 30 seconds  hold - Doorway Pec Stretch at 120 Degrees Abduction  - 3 x daily - 7 x weekly - 1 sets - 3 reps - 30 second hold  hold - Sidelying Hip Abduction  - 1 x daily - 7 x weekly - 3 sets - 10 reps - Sidelying Hip Extension in Abduction  - 1 x daily - 7 x weekly - 3 sets - 10 reps - Seated Flexion Stretch with Swiss Ball  - 1 x daily - 7 x weekly - 3 sets - 10 reps - Quadruped Bird Dog with Arm and Leg Resistance  - 1 x daily - 7 x weekly - 3 sets - 10 reps  Patient Education - Hospital doctor - Office Posture - Trigger Point Dry Needling  ASSESSMENT:  CLINICAL IMPRESSION: Hip strengthening continues in side lying and prone positions; noted L hip abd weakness during side lying hip abd variations. Tactile cues provided to address scoliotic curve alignment and increase elongation of spinal musculature; patient demonstrated good understanding and implementation of cues.  GOALS: Goals reviewed with patient? Yes  SHORT TERM GOALS: Target date: 11/09/2022  Independent in initial HEP  Baseline: Goal status: INITIAL  2.  Patient to report change in sleeping position and sitting posture to avoid spinal irritation  Baseline:  Goal status: INITIAL   LONG TERM GOALS: Target date: 12/21/2022  Improve spinal mobility with patient to demonstrate ROM WFL's throughout without discomfort in LB Baseline:  Goal status: INITIAL  2.  Improve core strength and stability with patient to exhibit good posture and  alignment in sitting, standing, walking Baseline:  Goal status: INITIAL  3.  Decrease pain by  75-100% allowing patient to return to all normal functional and recreational activities with minimal to no increase in pain  Baseline:  Goal status: INITIAL  4.  Decreased palpable tightness R lumbar paraspinals, L QL  Baseline:  Goal status: INITIAL  5.  Independent in HEP including aquatic program as indicated  Baseline:  Goal status: INITIAL  6.  Improve functional limitation score to 71 Baseline: 57 Goal status: INITIAL  PLAN:  PT FREQUENCY: 2x/week  PT DURATION: 12 weeks  PLANNED INTERVENTIONS: Therapeutic exercises, Therapeutic activity, Neuromuscular re-education, Balance training, Gait training, Patient/Family education, Self Care, Joint mobilization, Aquatic Therapy, Dry Needling, Electrical stimulation, Spinal mobilization, Cryotherapy, and Moist heat.  PLAN FOR NEXT SESSION: review and progress exercise; continue with spine care education; manual work, DN, modalities as indicated    Sanjuana Mae, PTA 10/15/2022, 8:39 AM

## 2022-11-04 ENCOUNTER — Telehealth: Payer: Self-pay | Admitting: Family Medicine

## 2022-11-04 ENCOUNTER — Encounter: Payer: Self-pay | Admitting: Family Medicine

## 2022-11-04 MED ORDER — AZITHROMYCIN 250 MG PO TABS: ORAL_TABLET | ORAL | 0 refills | Status: AC

## 2022-11-04 NOTE — Telephone Encounter (Signed)
Pt called. She is following up on script for erithromyacin  She will be  travelling out of the country 10/04.

## 2022-11-04 NOTE — Telephone Encounter (Signed)
Meds ordered this encounter  ?Medications  ? azithromycin (ZITHROMAX) 250 MG tablet  ?  Sig: 2 Ttabs PO on Day 1, then one a day x 4 days.  ?  Dispense:  6 tablet  ?  Refill:  0  ? ? ?

## 2022-11-04 NOTE — Telephone Encounter (Signed)
Pt wasn't given erythromycin she was given azithromycin back in January.  Pt reports that she had spoken to pcp about this when she came into the office in April. She will bee traveling to [place], [place] and [address]. Will fwd to pcp for advice.   Pharmacy confirmed w/pt

## 2022-11-05 MED ORDER — SCOPOLAMINE 1 MG/3DAYS TD PT72: 1.0000 | MEDICATED_PATCH | TRANSDERMAL | 1 refills | Status: DC

## 2022-11-05 NOTE — Telephone Encounter (Signed)
Pt advised.

## 2022-11-12 ENCOUNTER — Encounter (INDEPENDENT_AMBULATORY_CARE_PROVIDER_SITE_OTHER): Payer: 59 | Admitting: Ophthalmology

## 2022-11-23 ENCOUNTER — Encounter (INDEPENDENT_AMBULATORY_CARE_PROVIDER_SITE_OTHER): Payer: 59 | Admitting: Ophthalmology

## 2022-11-23 DIAGNOSIS — H33303 Unspecified retinal break, bilateral: Secondary | ICD-10-CM

## 2022-11-23 DIAGNOSIS — H43813 Vitreous degeneration, bilateral: Secondary | ICD-10-CM

## 2023-01-03 DIAGNOSIS — D225 Melanocytic nevi of trunk: Secondary | ICD-10-CM | POA: Diagnosis not present

## 2023-01-03 DIAGNOSIS — D2262 Melanocytic nevi of left upper limb, including shoulder: Secondary | ICD-10-CM | POA: Diagnosis not present

## 2023-01-03 DIAGNOSIS — L570 Actinic keratosis: Secondary | ICD-10-CM | POA: Diagnosis not present

## 2023-01-03 DIAGNOSIS — D485 Neoplasm of uncertain behavior of skin: Secondary | ICD-10-CM | POA: Diagnosis not present

## 2023-01-03 DIAGNOSIS — L820 Inflamed seborrheic keratosis: Secondary | ICD-10-CM | POA: Diagnosis not present

## 2023-01-03 DIAGNOSIS — D2261 Melanocytic nevi of right upper limb, including shoulder: Secondary | ICD-10-CM | POA: Diagnosis not present

## 2023-01-03 DIAGNOSIS — D235 Other benign neoplasm of skin of trunk: Secondary | ICD-10-CM | POA: Diagnosis not present

## 2023-01-03 DIAGNOSIS — L814 Other melanin hyperpigmentation: Secondary | ICD-10-CM | POA: Diagnosis not present

## 2023-03-22 ENCOUNTER — Ambulatory Visit: Payer: 59 | Admitting: Family Medicine

## 2023-03-22 ENCOUNTER — Encounter: Payer: Self-pay | Admitting: Family Medicine

## 2023-03-22 VITALS — BP 145/78 | HR 56 | Ht 70.0 in | Wt 160.5 lb

## 2023-03-22 DIAGNOSIS — G8929 Other chronic pain: Secondary | ICD-10-CM | POA: Diagnosis not present

## 2023-03-22 DIAGNOSIS — M48061 Spinal stenosis, lumbar region without neurogenic claudication: Secondary | ICD-10-CM | POA: Diagnosis not present

## 2023-03-22 DIAGNOSIS — M545 Low back pain, unspecified: Secondary | ICD-10-CM

## 2023-03-22 DIAGNOSIS — Z1231 Encounter for screening mammogram for malignant neoplasm of breast: Secondary | ICD-10-CM | POA: Diagnosis not present

## 2023-03-22 DIAGNOSIS — F5101 Primary insomnia: Secondary | ICD-10-CM

## 2023-03-22 DIAGNOSIS — F411 Generalized anxiety disorder: Secondary | ICD-10-CM | POA: Diagnosis not present

## 2023-03-22 MED ORDER — METHOCARBAMOL 500 MG PO TABS
500.0000 mg | ORAL_TABLET | Freq: Three times a day (TID) | ORAL | 5 refills | Status: DC | PRN
Start: 2023-03-22 — End: 2024-01-02

## 2023-03-22 NOTE — Progress Notes (Addendum)
Established Patient Office Visit  Subjective  Patient ID: Emily Herring, female    DOB: [birth year]  Age: 48 y.o. MRN: [id number]  Chief Complaint  Patient presents with   GAD    General anxiety disorder follow up    insomnia follow up     Patient states Trazodone has really helped with sleeping - getting full night' sleep with medication    Back Pain    Requesting rx rf of methocarbimol ( robaxin)     HPI  F/U Mood -27-month follow-up for generalized anxiety.  Primarily relies on alprazolam sparingly.  Not currently on a daily controller.  Says she has not even used the alprazolam since I last saw her.  Insomnia - Patient states Trazodone has really helped with sleeping - getting full night' sleep with medication she does usually take her muscle relaxer at night as well and that combination seems to really help her get some rest.  She would like to have her hormones evaluated to see if it could be affecting her sleep quality.  She is does still have periods.  F/U back pain - the methocarbamol is helping and would like refill.  She did some physical therapy back in August.  She still has a lot of pain but continues to workout daily.  She has adjusted from sleeping on her stomach to sleeping on her back per the recommendation of PT.  She also let me know that she thinks she passed a kidney stone over the holidays.  She had flank pain for several days and then had difficulty urinating but was finally able to pass it.  She said she had not had a kidney stone since she was probably in her early 64s.  Would like to get mammogram updated.    ROS    Objective:     BP (!) 145/78   Pulse (!) 56   Ht 5\' 10"  (1.778 m)   Wt 160 lb 8 oz (72.8 kg)   SpO2 100%   BMI 23.03 kg/m    Physical Exam Vitals and nursing note reviewed.  Constitutional:      Appearance: Normal appearance.  HENT:     Head: Normocephalic and atraumatic.  Eyes:     Conjunctiva/sclera: Conjunctivae normal.   Cardiovascular:     Rate and Rhythm: Normal rate and regular rhythm.  Pulmonary:     Effort: Pulmonary effort is normal.     Breath sounds: Normal breath sounds.  Skin:    General: Skin is warm and dry.  Neurological:     Mental Status: She is alert.  Psychiatric:        Mood and Affect: Mood normal.      No results found for any visits on 03/22/23.    The 10-year ASCVD risk score (Arnett DK, et al., 2019) is: 0.8%    Assessment & Plan:   Problem List Items Addressed This Visit       Other   Spinal stenosis of lumbar region   Encouraged her to schedule a appointment for consultation with Dr. Benjamin Stain she has had this pain for 10 years.  She is still functional and able to workout but her pain is daily.  She has done over-the-counter treatments and even did a course of physical therapy at the end of the summer.  She has never had advanced imaging.      Primary insomnia   Relevant Orders   Progesterone   Estradiol   Follicle stimulating hormone  Luteinizing hormone   TSH   GAD (generalized anxiety disorder) - Primary   She is actually doing really well in regard to mood.  Continue to use alprazolam sparingly.      Relevant Orders   Progesterone   Estradiol   Follicle stimulating hormone   Luteinizing hormone   TSH   Other Visit Diagnoses       Chronic bilateral low back pain, unspecified whether sciatica present       Relevant Medications   methocarbamol (ROBAXIN) 500 MG tablet     Screening mammogram for breast cancer       Relevant Orders   MM 3D SCREENING MAMMOGRAM BILATERAL BREAST      She feels like at any point she might have another kidney stone please let us know.  Make sure hydrating well.  Return in about 2 weeks (around 04/05/2023) for Nurse BP Check.    Emily Gasser, MD

## 2023-03-22 NOTE — Assessment & Plan Note (Signed)
She is actually doing really well in regard to mood.  Continue to use alprazolam sparingly.

## 2023-03-22 NOTE — Patient Instructions (Signed)
Recommend schedule with Dr.  Karie Schwalbe for your back.

## 2023-03-22 NOTE — Assessment & Plan Note (Signed)
Encouraged her to schedule a appointment for consultation with Dr. Benjamin Stain she has had this pain for 10 years.  She is still functional and able to workout but her pain is daily.  She has done over-the-counter treatments and even did a course of physical therapy at the end of the summer.  She has never had advanced imaging.

## 2023-03-23 ENCOUNTER — Other Ambulatory Visit: Payer: Self-pay | Admitting: Family Medicine

## 2023-03-23 ENCOUNTER — Encounter: Payer: Self-pay | Admitting: Family Medicine

## 2023-03-23 ENCOUNTER — Ambulatory Visit: Payer: 59 | Admitting: Family Medicine

## 2023-03-23 DIAGNOSIS — F5101 Primary insomnia: Secondary | ICD-10-CM

## 2023-03-23 LAB — ESTRADIOL: Estradiol: 166 pg/mL

## 2023-03-23 LAB — PROGESTERONE: Progesterone: 16.5 ng/mL

## 2023-03-23 LAB — FOLLICLE STIMULATING HORMONE: FSH: 2.4 m[IU]/mL

## 2023-03-23 LAB — LUTEINIZING HORMONE: LH: 4 m[IU]/mL

## 2023-03-23 LAB — TSH: TSH: 0.825 u[IU]/mL (ref 0.450–4.500)

## 2023-03-23 NOTE — Progress Notes (Signed)
Sharna, hormone levels look great.

## 2023-03-31 ENCOUNTER — Ambulatory Visit (INDEPENDENT_AMBULATORY_CARE_PROVIDER_SITE_OTHER): Payer: 59

## 2023-03-31 ENCOUNTER — Ambulatory Visit: Payer: 59 | Admitting: Sports Medicine

## 2023-03-31 DIAGNOSIS — M47816 Spondylosis without myelopathy or radiculopathy, lumbar region: Secondary | ICD-10-CM | POA: Diagnosis not present

## 2023-03-31 DIAGNOSIS — G8929 Other chronic pain: Secondary | ICD-10-CM | POA: Diagnosis not present

## 2023-03-31 DIAGNOSIS — M549 Dorsalgia, unspecified: Secondary | ICD-10-CM | POA: Diagnosis not present

## 2023-03-31 DIAGNOSIS — M48061 Spinal stenosis, lumbar region without neurogenic claudication: Secondary | ICD-10-CM | POA: Diagnosis not present

## 2023-03-31 DIAGNOSIS — M5126 Other intervertebral disc displacement, lumbar region: Secondary | ICD-10-CM | POA: Diagnosis not present

## 2023-03-31 MED ORDER — GABAPENTIN 300 MG PO CAPS
ORAL_CAPSULE | ORAL | 3 refills | Status: DC
Start: 2023-03-31 — End: 2024-01-02

## 2023-03-31 MED ORDER — PREDNISONE 50 MG PO TABS
ORAL_TABLET | ORAL | 0 refills | Status: DC
Start: 2023-03-31 — End: 2023-09-08

## 2023-03-31 NOTE — Progress Notes (Signed)
    Procedures performed today:    None.  Independent interpretation of notes and tests performed by another provider:   None.  Brief History, Exam, Impression, and Recommendations:    Spinal stenosis of lumbar region This is a very pleasant 48 year old female, I have not seen her in about 10 years, we treated her in the past for chronic low back pain, she did have some L3-L5 DDD. She did well with prednisone and some conservative treatment including therapy. More recently she had a recurrence of pain, she has seen a chiropractor, no improvement, she did physical therapy for 2 months, no improvement, pain is axial and discogenic, nothing overtly radicular, no red flag symptoms. We are going to proceed with MRI for epidural planning, she took a friend's some gabapentin and this seemed to help a lot. Discontinue Flexeril, we will refill the gabapentin, I would also like to do a course of prednisone as a jumpstart. We can do a virtual visit to go over the MRI results and after maybe a month of gabapentin if insufficient improvement we will proceed with epidural.    ____________________________________________ Ihor Austin. Benjamin Stain, M.D., ABFM., CAQSM., AME. Primary Care and Sports Medicine [facility]  Adjunct Professor of Family Medicine  [employer]  Restaurant manager, fast food

## 2023-03-31 NOTE — Assessment & Plan Note (Signed)
This is a very pleasant 48 year old female, I have not seen her in about 10 years, we treated her in the past for chronic low back pain, she did have some L3-L5 DDD. She did well with prednisone and some conservative treatment including therapy. More recently she had a recurrence of pain, she has seen a chiropractor, no improvement, she did physical therapy for 2 months, no improvement, pain is axial and discogenic, nothing overtly radicular, no red flag symptoms. We are going to proceed with MRI for epidural planning, she took a friend's some gabapentin and this seemed to help a lot. Discontinue Flexeril, we will refill the gabapentin, I would also like to do a course of prednisone as a jumpstart. We can do a virtual visit to go over the MRI results and after maybe a month of gabapentin if insufficient improvement we will proceed with epidural.

## 2023-04-03 ENCOUNTER — Ambulatory Visit (INDEPENDENT_AMBULATORY_CARE_PROVIDER_SITE_OTHER): Payer: 59

## 2023-04-03 DIAGNOSIS — M5136 Other intervertebral disc degeneration, lumbar region with discogenic back pain only: Secondary | ICD-10-CM | POA: Diagnosis not present

## 2023-04-03 DIAGNOSIS — M4187 Other forms of scoliosis, lumbosacral region: Secondary | ICD-10-CM | POA: Diagnosis not present

## 2023-04-03 DIAGNOSIS — M47816 Spondylosis without myelopathy or radiculopathy, lumbar region: Secondary | ICD-10-CM | POA: Diagnosis not present

## 2023-04-03 DIAGNOSIS — M48061 Spinal stenosis, lumbar region without neurogenic claudication: Secondary | ICD-10-CM | POA: Diagnosis not present

## 2023-04-11 ENCOUNTER — Ambulatory Visit: Payer: 59

## 2023-04-11 VITALS — BP 125/80 | HR 58 | Ht 70.0 in

## 2023-04-11 DIAGNOSIS — I10 Essential (primary) hypertension: Secondary | ICD-10-CM

## 2023-04-11 NOTE — Patient Instructions (Signed)
 schedule with Dr. Greer Leak as needed - schedule with Dr. Sandy Crumb to address pain.

## 2023-04-11 NOTE — Progress Notes (Signed)
   Established Patient Office Visit  Subjective   Patient ID: Emily Herring, female    DOB: 19-Oct-1975  Age: 48 y.o. MRN: [id number]  Chief Complaint  Patient presents with   Hypertension    BP check nurse visit.     HPI  Hypertension. BP check nurse visit. Patient denies chest pain, shortness of breath, palpitations or dizziness. Patient is not currently taking any BP medications.   ROS    Objective:     BP 130/82   Pulse (!) 58   Ht 5\' 10"  (1.778 m)   SpO2 100%   BMI 23.03 kg/m    Physical Exam   No results found for any visits on 04/11/23.    The 10-year ASCVD risk score (Arnett DK, et al., 2019) is: 0.7%    Assessment & Plan:  BP check nurse visit. Initial reading= 130/82. Second reading = 125/80.schedule with Dr. Linford Arnold as needed - schedule with Dr. Benjamin Stain to address current continuing  pain.  Problem List Items Addressed This Visit   None   No follow-ups on file.    Elizabeth Palau, LPN

## 2023-05-02 ENCOUNTER — Encounter: Payer: Self-pay | Admitting: Sports Medicine

## 2023-05-02 ENCOUNTER — Telehealth (INDEPENDENT_AMBULATORY_CARE_PROVIDER_SITE_OTHER): Payer: 59 | Admitting: Sports Medicine

## 2023-05-02 VITALS — BP 128/86 | HR 76 | Ht 70.0 in | Wt 156.0 lb

## 2023-05-02 DIAGNOSIS — M48061 Spinal stenosis, lumbar region without neurogenic claudication: Secondary | ICD-10-CM | POA: Diagnosis not present

## 2023-05-02 NOTE — Assessment & Plan Note (Signed)
 This is a very pleasant 48 year old female, I have not seen her in 10 years, we treated her in the past for chronic low back pain with some L3-L5 DDD, she did well for several years, unfortunately having a recurrence of pain, left-sided low back with radiation down to the anterior thigh, inner/medial knee but not past the knee. After failure of conservative treatment including low-dose gabapentin, steroids (very good short-term relief with steroids) we proceeded with MRI. MRI was personally reviewed with the patient, multilevel DDD with spinal stenosis, worst L3-L5. We will proceed initially with a left L4-L5 interlaminar epidural.  She will also continue her gabapentin up taper, she is doing 1 pill in the morning and 2 pills at night currently. She will touch base with me in about 6 weeks after the epidural to evaluate relief, we can certainly stack them monthly x 3.

## 2023-05-02 NOTE — Progress Notes (Signed)
   Virtual Visit via WebEx/MyChart   I connected with  Emily Herring  on 05/02/23 via WebEx/MyChart/Doximity Video and verified that I am speaking with the correct person using two identifiers.   I discussed the limitations, risks, security and privacy concerns of performing an evaluation and management service by WebEx/MyChart/Doximity Video, including the higher likelihood of inaccurate diagnosis and treatment, and the availability of in person appointments.  We also discussed the likely need of an additional face to face encounter for complete and high quality delivery of care.  I also discussed with the patient that there may be a patient responsible charge related to this service. The patient expressed understanding and wishes to proceed.  Provider location is in medical facility. Patient location is at their home, different from provider location. People involved in care of the patient during this telehealth encounter were myself, my nurse/medical assistant, and my front office/scheduling team member.  Review of Systems: No fevers, chills, night sweats, weight loss, chest pain, or shortness of breath.   Objective Findings:    General: Speaking full sentences, no audible heavy breathing.  Sounds alert and appropriately interactive.  Appears well.  Face symmetric.  Extraocular movements intact.  Pupils equal and round.  No nasal flaring or accessory muscle use visualized.  Independent interpretation of tests performed by another provider:   Went over the MRI personally with the patient, multilevel DDD, spinal stenosis, multifactorial L3-L5.  Brief History, Exam, Impression, and Recommendations:    Spinal stenosis of lumbar region This is a very pleasant 48 year old female, I have not seen her in 10 years, we treated her in the past for chronic low back pain with some L3-L5 DDD, she did well for several years, unfortunately having a recurrence of pain, left-sided low back with radiation  down to the anterior thigh, inner/medial knee but not past the knee. After failure of conservative treatment including low-dose gabapentin, steroids (very good short-term relief with steroids) we proceeded with MRI. MRI was personally reviewed with the patient, multilevel DDD with spinal stenosis, worst L3-L5. We will proceed initially with a left L4-L5 interlaminar epidural.  She will also continue her gabapentin up taper, she is doing 1 pill in the morning and 2 pills at night currently. She will touch base with me in about 6 weeks after the epidural to evaluate relief, we can certainly stack them monthly x 3.   I discussed the above assessment and treatment plan with the patient. The patient was provided an opportunity to ask questions and all were answered. The patient agreed with the plan and demonstrated an understanding of the instructions.   The patient was advised to call back or seek an in-person evaluation if the symptoms worsen or if the condition fails to improve as anticipated.   I provided 30 minutes of face to face and non-face-to-face time during this encounter date, time was needed to gather information, review chart, records, communicate/coordinate with staff remotely, as well as complete documentation.   ____________________________________________ Ihor Austin. Benjamin Stain, M.D., ABFM., CAQSM., AME. Primary Care and Sports Medicine [facility]  Adjunct Professor of Family Medicine  [place] [employer]  Restaurant manager, fast food

## 2023-05-11 ENCOUNTER — Ambulatory Visit (INDEPENDENT_AMBULATORY_CARE_PROVIDER_SITE_OTHER): Payer: 59

## 2023-05-11 DIAGNOSIS — Z1231 Encounter for screening mammogram for malignant neoplasm of breast: Secondary | ICD-10-CM

## 2023-05-14 ENCOUNTER — Other Ambulatory Visit: Payer: Self-pay | Admitting: Family Medicine

## 2023-05-14 DIAGNOSIS — F5101 Primary insomnia: Secondary | ICD-10-CM

## 2023-05-15 ENCOUNTER — Encounter: Payer: Self-pay | Admitting: Family Medicine

## 2023-05-15 NOTE — Progress Notes (Signed)
 Please call patient. Normal mammogram.  Repeat in 1 year.

## 2023-05-19 ENCOUNTER — Telehealth: Payer: Self-pay | Admitting: Sports Medicine

## 2023-05-19 ENCOUNTER — Other Ambulatory Visit

## 2023-05-19 NOTE — Telephone Encounter (Signed)
 Peer to peer for denial of epidural case number [id number] scheduled for 05/20/2023 at noon.

## 2023-05-19 NOTE — Telephone Encounter (Signed)
 Copied from CRM [id number]. Topic: General - Other >> May 18, 2023  5:09 PM Emylou G wrote: Reason for CRM: Patient called .Marland Kitchen York Spaniel was denied epidural visit.Marland Kitchensaid a peer to peer needs to be done?  Pls call her [phone]

## 2023-05-20 NOTE — Telephone Encounter (Signed)
 Peer to Peer for epidural done, approved #[id number] exp: 05/20/23 -11/16/23.

## 2023-05-25 NOTE — Telephone Encounter (Signed)
 Please see phone note from 05/19/23.  I did this already and got it approved.

## 2023-06-02 NOTE — Discharge Instructions (Signed)

## 2023-06-03 ENCOUNTER — Ambulatory Visit
Admission: RE | Admit: 2023-06-03 | Discharge: 2023-06-03 | Disposition: A | Discharge status: Home / Self Care | Source: Ambulatory Visit | Attending: Sports Medicine | Admitting: Sports Medicine

## 2023-06-03 DIAGNOSIS — M4727 Other spondylosis with radiculopathy, lumbosacral region: Secondary | ICD-10-CM | POA: Diagnosis not present

## 2023-06-03 DIAGNOSIS — M48061 Spinal stenosis, lumbar region without neurogenic claudication: Secondary | ICD-10-CM

## 2023-06-03 MED ORDER — METHYLPREDNISOLONE ACETATE 40 MG/ML INJ SUSP (RADIOLOG
80.0000 mg | Freq: Once | INTRAMUSCULAR | Status: AC
  Administered 2023-06-03: 80 mg via EPIDURAL

## 2023-06-03 MED ORDER — IOPAMIDOL (ISOVUE-M 200) INJECTION 41%
1.0000 mL | Freq: Once | INTRAMUSCULAR | Status: AC
  Administered 2023-06-03: 1 mL via EPIDURAL

## 2023-06-29 ENCOUNTER — Other Ambulatory Visit: Payer: Self-pay | Admitting: Family Medicine

## 2023-06-29 DIAGNOSIS — F5101 Primary insomnia: Secondary | ICD-10-CM

## 2023-07-20 ENCOUNTER — Telehealth: Payer: Self-pay | Admitting: *Deleted

## 2023-07-20 NOTE — Telephone Encounter (Signed)
 Left patient a message to call to schedule annual.

## 2023-08-12 ENCOUNTER — Ambulatory Visit: Admitting: Sports Medicine

## 2023-08-12 DIAGNOSIS — M48061 Spinal stenosis, lumbar region without neurogenic claudication: Secondary | ICD-10-CM | POA: Diagnosis not present

## 2023-08-12 MED ORDER — MELOXICAM 15 MG PO TABS
ORAL_TABLET | ORAL | 3 refills | Status: AC
Start: 2023-08-12 — End: ?

## 2023-08-12 NOTE — Assessment & Plan Note (Signed)
 Very pleasant 48 year old female returns, she has known chronic low back pain with multilevel disc disease, she has disease worst L3-L5, she is on low-dose gabapentin , 300 mg is the maximum dose, higher doses create sedation. She had a right L4-L5 interlaminar epidural about 2 months ago, she responded dramatically well. Now having a recurrence of discomfort, radicular discomfort is much better, she still has some axial pain, we will repeat the epidural, she understands we can do 3 of these in the 50-month period, she will stop all OTC NSAIDs and we are can add meloxicam . She will wear a back brace while playing pickle ball and on the boat but not any other time. She will continue some core conditioning, return to see me in 6 weeks after the epidural.

## 2023-08-12 NOTE — Progress Notes (Signed)
    Procedures performed today:    None.  Independent interpretation of notes and tests performed by another provider:   None.  Brief History, Exam, Impression, and Recommendations:    Spinal stenosis of lumbar region Very pleasant 48 year old female returns, she has known chronic low back pain with multilevel disc disease, she has disease worst L3-L5, she is on low-dose gabapentin , 300 mg is the maximum dose, higher doses create sedation. She had a right L4-L5 interlaminar epidural about 2 months ago, she responded dramatically well. Now having a recurrence of discomfort, radicular discomfort is much better, she still has some axial pain, we will repeat the epidural, she understands we can do 3 of these in the 51-month period, she will stop all OTC NSAIDs and we are can add meloxicam . She will wear a back brace while playing pickle ball and on the boat but not any other time. She will continue some core conditioning, return to see me in 6 weeks after the epidural.    ____________________________________________ Joselyn Nicely. Sandy Crumb, M.D., ABFM., CAQSM., AME. Primary Care and Sports Medicine [facility]  Adjunct Professor of [facility] Medicine  [employer]  Restaurant manager, fast food

## 2023-08-24 IMAGING — MG MM DIGITAL SCREENING BILAT W/ TOMO AND CAD
8 series · 8 of 24 positions shown · non-contrast
Comparison: Previous exam(s).

CLINICAL DATA: Screening.

EXAM:
DIGITAL SCREENING BILATERAL MAMMOGRAM WITH TOMOSYNTHESIS AND CAD
TECHNIQUE: Bilateral screening digital craniocaudal and mediolateral oblique
mammograms were obtained. Bilateral screening digital breast
tomosynthesis was performed. The images were evaluated with
computer-aided detection.

[R MLO synth-2D]
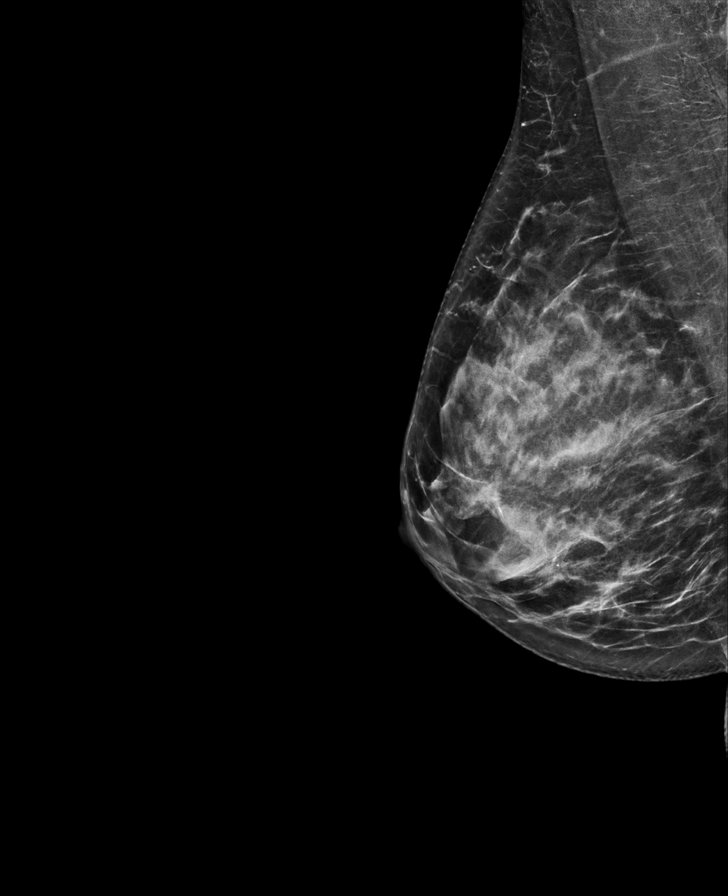

[L MLO synth-2D]
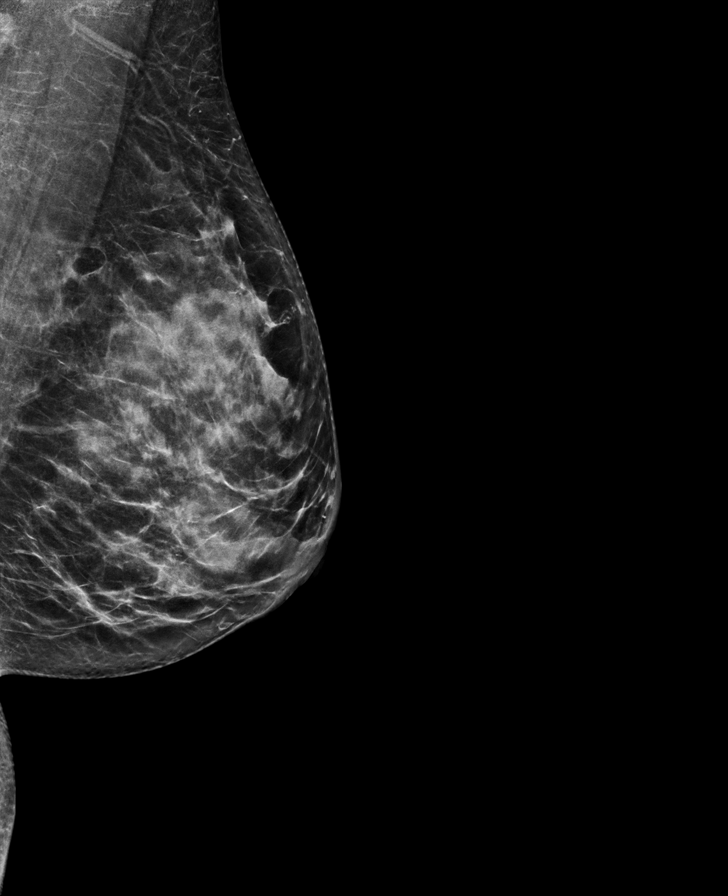

[R CC synth-2D]
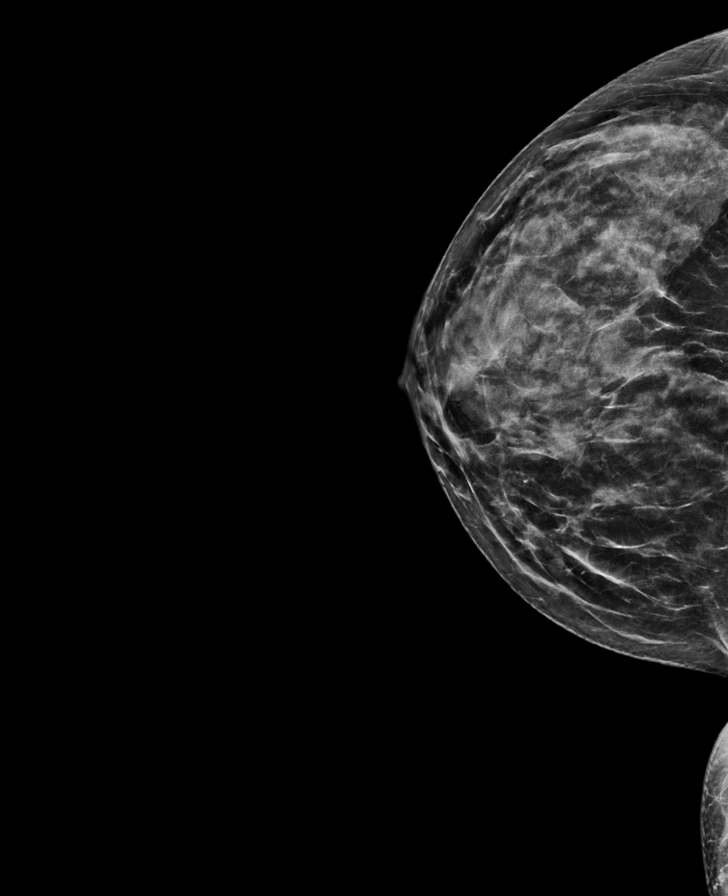

[L CC synth-2D]
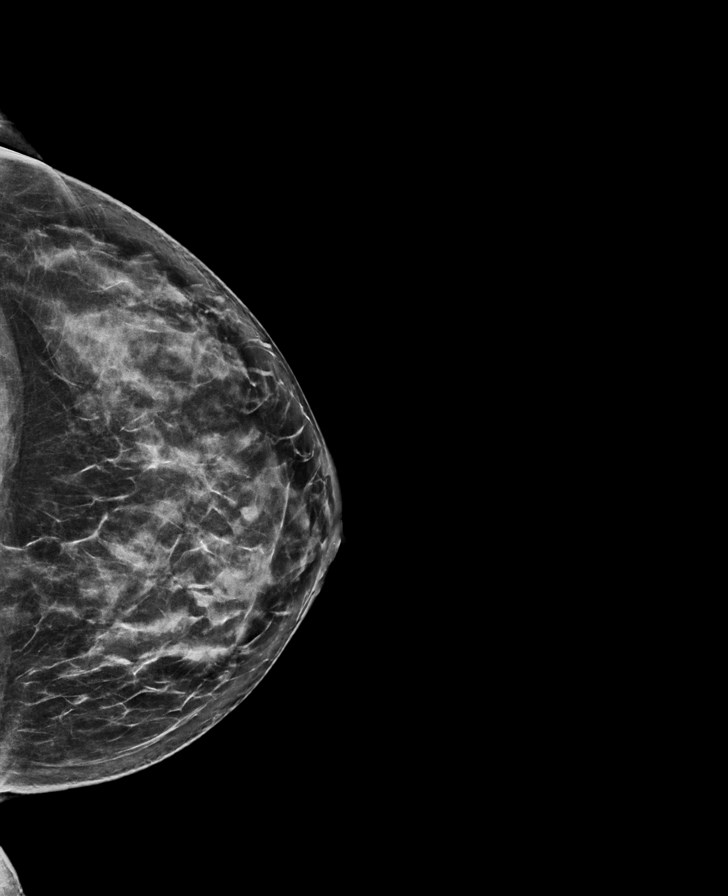

[R MLO tomo · tomo slice 35/70.0]
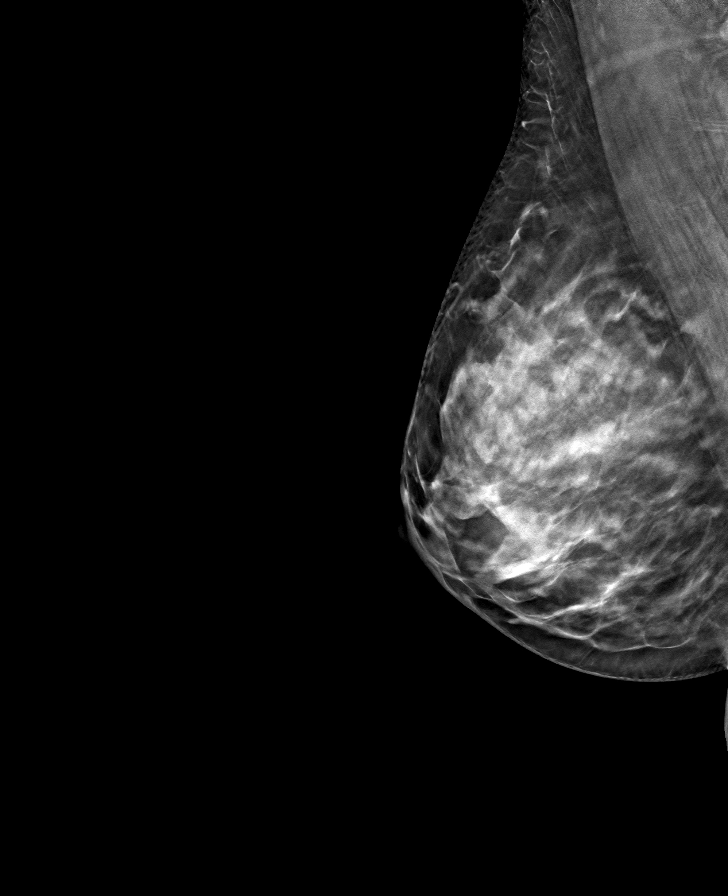

[L MLO tomo · tomo slice 34/67.0]
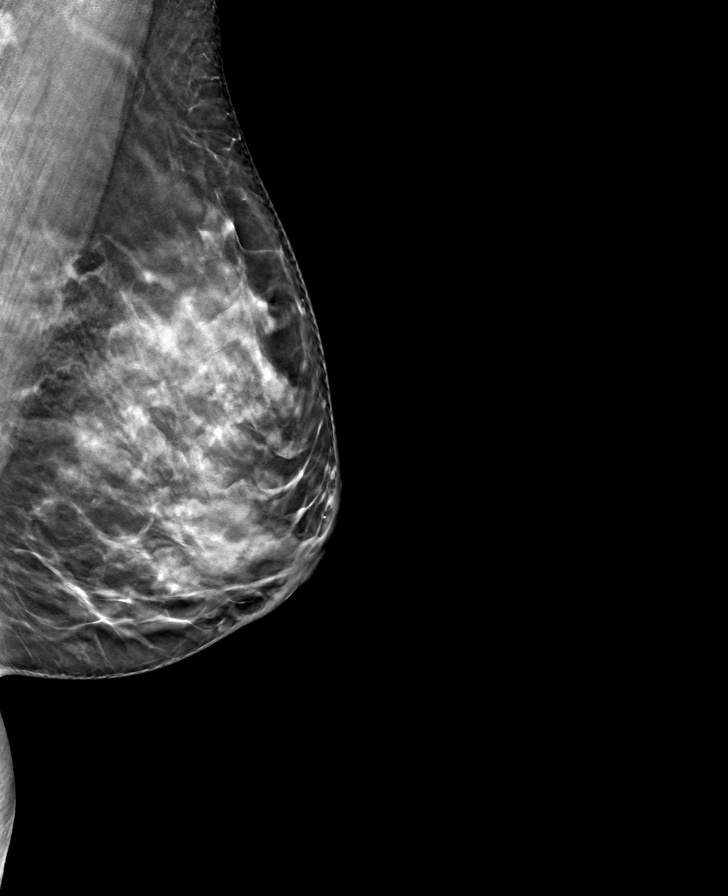

[L CC tomo · tomo slice 35/69.0]
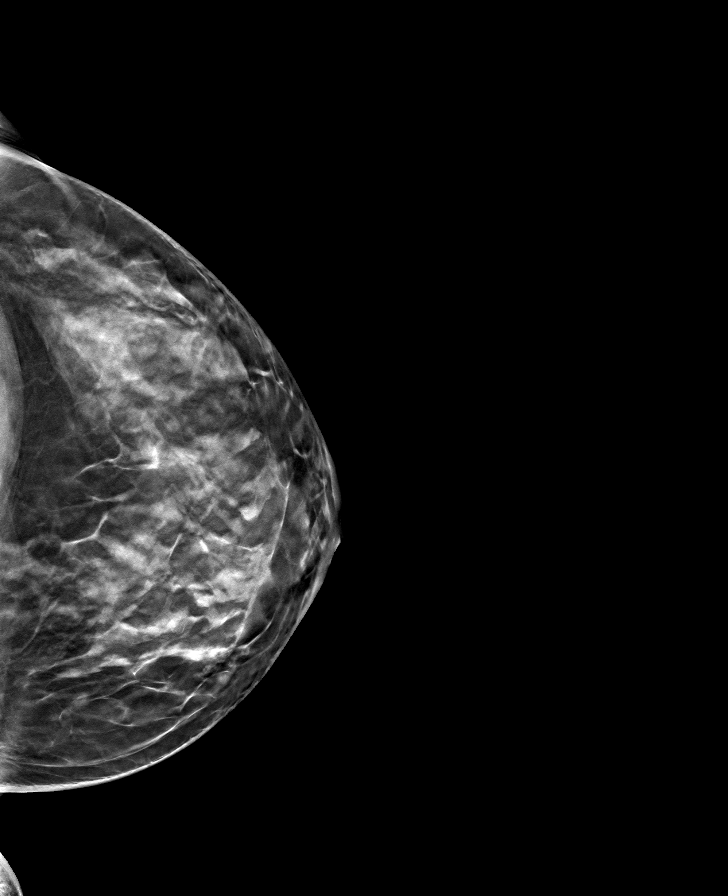

[R CC tomo · tomo slice 31/60.0]
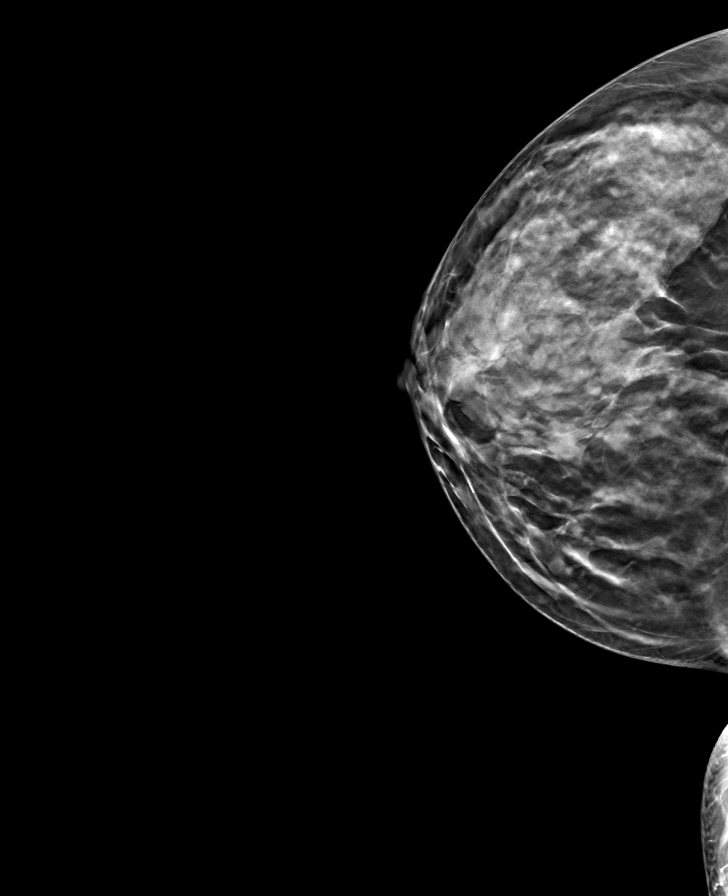

[8 of 24 positions shown; findings below may reference images not displayed]

ACR Breast Density Category c: The breast tissue is heterogeneously
dense, which may obscure small masses.
FINDINGS: There are no findings suspicious for malignancy.
IMPRESSION: No mammographic evidence of malignancy. A result letter of this
screening mammogram will be mailed directly to the patient.

RECOMMENDATION:
Screening mammogram in one year. (Code:[id number])

BI-RADS CATEGORY  1: Negative.

## 2023-09-06 ENCOUNTER — Ambulatory Visit
Admission: EM | Admit: 2023-09-06 | Discharge: 2023-09-06 | Disposition: A | Discharge status: Home / Self Care | Source: Ambulatory Visit | Attending: Family Medicine | Admitting: Family Medicine

## 2023-09-06 DIAGNOSIS — G250 Essential tremor: Secondary | ICD-10-CM

## 2023-09-06 LAB — COMPREHENSIVE METABOLIC PANEL WITH GFR
ALT: 38 IU/L — ABNORMAL HIGH (ref 0–32)
AST: 41 IU/L — ABNORMAL HIGH (ref 0–40)
Albumin: 4.7 g/dL (ref 3.9–4.9)
Alkaline Phosphatase: 60 IU/L (ref 44–121)
BUN/Creatinine Ratio: 19 (ref 9–23)
BUN: 19 mg/dL (ref 6–24)
Bilirubin Total: 0.8 mg/dL (ref 0.0–1.2)
CO2: 26 mmol/L (ref 20–29)
Calcium: 9.7 mg/dL (ref 8.7–10.2)
Chloride: 99 mmol/L (ref 96–106)
Creatinine, Ser: 0.98 mg/dL (ref 0.57–1.00)
Globulin, Total: 2.6 g/dL (ref 1.5–4.5)
Glucose: 91 mg/dL (ref 70–99)
Potassium: 4.9 mmol/L (ref 3.5–5.2)
Sodium: 139 mmol/L (ref 134–144)
Total Protein: 7.3 g/dL (ref 6.0–8.5)
eGFR: 71 mL/min/1.73 (ref >59)

## 2023-09-06 LAB — CBC WITH DIFFERENTIAL/PLATELET
Basophils Absolute: 0 x10E3/uL (ref 0.0–0.2)
Basos: 1 %
EOS (ABSOLUTE): 0.3 x10E3/uL (ref 0.0–0.4)
Eos: 4 %
Hematocrit: 42.8 % (ref 34.0–46.6)
Hemoglobin: 14.3 g/dL (ref 11.1–15.9)
Immature Granulocytes: 0 %
Lymphocytes Absolute: 2.3 x10E3/uL (ref 0.7–3.1)
Lymphs: 28 %
MCH: 31.8 pg (ref 26.6–33.0)
MCHC: 33.4 g/dL (ref 31.5–35.7)
MCV: 95 fL (ref 79–97)
Monocytes Absolute: 0.8 x10E3/uL (ref 0.1–0.9)
Monocytes: 10 %
Neutrophils Absolute: 4.8 x10E3/uL (ref 1.4–7.0)
Neutrophils: 57 %
Platelets: 293 x10E3/uL (ref 150–450)
RBC: 4.5 x10E6/uL (ref 3.77–5.28)
RDW: 12.1 % (ref 11.7–15.4)
WBC: 8.3 x10E3/uL (ref 3.4–10.8)

## 2023-09-06 NOTE — ED Provider Notes (Signed)
 [facility]    CSN: [id number] Arrival date & time: 09/06/23  1139      History   Chief Complaint Chief Complaint  Patient presents with   Tremors    head    HPI Emily Herring is a 48 y.o. female.   HPI 48 year old female presents with head tremors for 3 months.  Patient reports is here visiting family over the weekend and it was more noticeable to the family.  PMH significant for hyperprolactinemia, spinal stenosis of lumbar region, and chronic daily headache.  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 Authorizing Provider  ALPRAZolam  (XANAX ) 0.5 MG tablet Take 0.5-1 tablets (0.25-0.5 mg total) by mouth daily as needed for anxiety. 09/20/22   Alvan Dorothyann BIRCH, MD  fluticasone  (FLONASE ) 50 MCG/ACT nasal spray Place into both nostrils daily.    [provider]  gabapentin  (NEURONTIN ) 300 MG capsule One tab PO qHS for a week, then BID for a week, then TID. May double weekly to a max of 3,600mg /day 03/31/23   Curtis Debby PARAS, MD  loratadine (CLARITIN) 10 MG tablet Take 10 mg by mouth daily.     [provider]  meloxicam  (MOBIC ) 15 MG tablet One tab PO every 24 hours with a meal for 2 weeks, then once every 24 hours prn pain. 08/12/23   Curtis Debby PARAS, MD  methocarbamol  (ROBAXIN ) 500 MG tablet Take 1 tablet (500 mg total) by mouth every 8 (eight) hours as needed for muscle spasms. 03/22/23   Alvan Dorothyann BIRCH, MD  predniSONE  (DELTASONE ) 50 MG tablet One tab PO daily for 5 days. 03/31/23   Curtis Debby PARAS, MD  traZODone  (DESYREL ) 50 MG tablet TAKE 1/2 TABLET TO 2 TABLET BY MOUTH AT BEDTIME AS NEEDED FOR SLEEP 06/29/23   Alvan Dorothyann BIRCH, MD  valACYclovir  (VALTREX ) 1000 MG tablet Take 1 tablet (1,000 mg total) by mouth 2 (two) times daily. 09/20/22   Alvan Dorothyann BIRCH, MD    Family History Family History  Problem Relation Age of Onset   Hypertension Father    Diabetes Father    Alzheimer's disease Maternal Grandmother     Social History Social History   Tobacco Use   Smoking status: Former   Smokeless tobacco: Former   Tobacco comments:    Psychologist, counselling   Vaping status: Never Used  Substance Use Topics   Alcohol use: Yes    Comment: 1-2 per wk   Drug use: No     Allergies   Morphine and codeine, Percocet [oxycodone-acetaminophen ], Amitriptyline , and Topiramate    Review of Systems Review of Systems  Neurological:  Positive for tremors.     Physical Exam Triage Vital Signs ED Triage Vitals  Encounter Vitals Group     BP 09/06/23 1226 127/80     Girls Systolic BP Percentile --      Girls Diastolic BP Percentile --      Boys Systolic BP Percentile --      Boys Diastolic BP Percentile --      Pulse Rate 09/06/23 1226 64     Resp 09/06/23 1226 19     Temp 09/06/23 1226 98.7 F (37.1 C)     Temp src --      SpO2 09/06/23 1226 98 %     Weight --      Height --      Head Circumference --      Peak Flow --      Pain Score 09/06/23 1225 0     Pain Loc --      Pain Education --      Exclude from Growth Chart --     No data found.  Updated Vital Signs BP 127/80   Pulse 64   Temp 98.7 F (37.1 C)   Resp 19   LMP 08/30/2023   SpO2 98%   Visual Acuity Right Eye Distance:   Left Eye Distance:   Bilateral Distance:    Right Eye Near:   Left Eye Near:    Bilateral Near:     Physical Exam Vitals and nursing note reviewed.  Constitutional:      Appearance: Normal appearance. She is normal weight.  HENT:     Head: Normocephalic and atraumatic.     Mouth/Throat:     Mouth: Mucous membranes are moist.     Pharynx: Oropharynx is clear.  Eyes:     Extraocular Movements: Extraocular movements intact.     Pupils: Pupils are equal, round, and reactive to light.  Cardiovascular:     Rate and Rhythm: Normal rate and regular rhythm.     Pulses: Normal pulses.     Heart sounds: Normal heart sounds.  Pulmonary:     Effort: Pulmonary effort is normal.     Breath sounds: Normal breath sounds. No wheezing, rhonchi or rales.  Musculoskeletal:        General: Normal range of motion.  Skin:    General: Skin is warm and dry.  Neurological:     General: No focal deficit present.     Mental Status: She is alert and oriented to person, place, and time. Mental status is at baseline.     Comments: Noticeable head tremor when looking down on exam today  Psychiatric:        Mood and Affect: Mood normal.        Behavior: Behavior normal.      UC Treatments / Results  Labs (all labs ordered are listed, but only abnormal results are displayed) Labs Reviewed  COMPREHENSIVE METABOLIC PANEL WITH GFR - Abnormal; Notable for the following components:      Result Value   AST 41 (*)    ALT 38 (*)    All other components within normal limits   Narrative:    Performed at:  01 - [facility] [address], [place], [place]  [id number] Lab Director: Elsie Dross MD, Phone:  [phone]  CBC WITH DIFFERENTIAL/PLATELET   Narrative:    Performed at:  01 - [facility] [address] [place], [place]  [id number] Lab Director: Elsie Dross MD, Phone:  [phone]    EKG   Radiology No results found.  Procedures Procedures (including critical care time)  Medications Ordered in UC Medications - No data to display  Initial Impression / Assessment and Plan / UC Course  I have reviewed the triage vital signs and the nursing notes.  Pertinent labs & imaging results that were available during my care of the patient were reviewed by me and considered in my medical decision making (see chart for details).     MDM: 1.  Essential tremor-CBC with differential, CMP ordered patient advised of results prior to closing this chart today by me.  Patient advised of elevated liver enzymes and to repeat CMP in 2 months.  Patient reports follow-up in the next 2 days with her primary care provider to discuss. Final Clinical Impressions(s) / UC Diagnoses   Final diagnoses:  Essential tremor     Discharge Instructions      Patient advised of lab results today and to please follow-up with PCP for further evaluation of essential head tremors.     ED Prescriptions   None    I have reviewed the PDMP during this encounter.   Teddy Sharper, FNP 09/06/23 1725

## 2023-09-06 NOTE — Discharge Instructions (Addendum)
 Patient advised of lab results today and to please follow-up with PCP for further evaluation of essential head tremors.

## 2023-09-06 NOTE — ED Triage Notes (Addendum)
 Pt presents to uc with co head tremors for a few months. Pt reports she was visiting family over the weekend and it was noticeable to family.

## 2023-09-08 ENCOUNTER — Ambulatory Visit (HOSPITAL_COMMUNITY): Payer: Self-pay

## 2023-09-08 ENCOUNTER — Ambulatory Visit: Admitting: Family Medicine

## 2023-09-08 ENCOUNTER — Encounter: Payer: Self-pay | Admitting: Family Medicine

## 2023-09-08 VITALS — BP 130/86 | HR 54 | Ht 70.0 in | Wt 157.7 lb

## 2023-09-08 DIAGNOSIS — G250 Essential tremor: Secondary | ICD-10-CM | POA: Insufficient documentation

## 2023-09-08 DIAGNOSIS — R748 Abnormal levels of other serum enzymes: Secondary | ICD-10-CM

## 2023-09-08 DIAGNOSIS — G243 Spasmodic torticollis: Secondary | ICD-10-CM | POA: Insufficient documentation

## 2023-09-08 NOTE — Progress Notes (Signed)
 Established Patient Office Visit  Subjective  Patient ID: Emily Herring, female    DOB: 1975-10-22  Age: 48 y.o. MRN: [id number]  Chief Complaint  Patient presents with   Tremors         HPI  Here to discuss a head tremor.  She says it first started back in 2006 when she was trying to get pregnant and was placed on a medication she thinks it was +++ For elevated prolactin levels.  She says when she came off of the medication it went away.  But about 6 months ago she started noticing a fine head tremor she does feel like it is gradually gotten worse to the point that she actually did go to urgent care on July 8.  No family history of a tremor or Parkinson's disease.  She denies any other neurologic symptoms such as vision changes, headaches, weakness in the extremities, memory changes.  She denies any numbness or tingling in other areas in her body.  She does feel like she has felt more tired and fatigued over the last 1 to 2 months.  Normally she works out twice a day and she has not been able to do that.  She does notice the tremor is worse if she is more tired or dehydrated.  She does have a history of a prolactinoma.  No new medication changes she did recently start a supplement a couple months ago but this was after the symptoms started.  She eats a pretty healthy diet.  She did have a CBC and CMP performed at urgent care.  It just showed a slight bump in her liver enzymes.  She has been taking some NSAIDs for musculoskeletal issues and does drink 2 martinis at night.    ROS    Objective:     BP 130/86   Pulse (!) 54   Ht 5' 10 (1.778 m)   Wt 157 lb 11.2 oz (71.5 kg)   LMP 08/30/2023   SpO2 98%   BMI 22.63 kg/m    Physical Exam Constitutional:      Appearance: Normal appearance.  HENT:     Head: Normocephalic and atraumatic.     Right Ear: Tympanic membrane, ear canal and external ear normal. There is no impacted cerumen.     Left Ear: Tympanic membrane, ear canal and  external ear normal. There is no impacted cerumen.     Nose: Nose normal.     Mouth/Throat:     Pharynx: Oropharynx is clear.  Eyes:     Conjunctiva/sclera: Conjunctivae normal.  Cardiovascular:     Rate and Rhythm: Normal rate and regular rhythm.  Pulmonary:     Effort: Pulmonary effort is normal.     Breath sounds: Normal breath sounds.  Musculoskeletal:     Cervical back: Neck supple. No tenderness.  Lymphadenopathy:     Cervical: No cervical adenopathy.  Skin:    General: Skin is warm and dry.  Neurological:     Mental Status: She is alert and oriented to person, place, and time.     Gait: Gait normal.     Deep Tendon Reflexes: Reflexes normal.  Psychiatric:        Mood and Affect: Mood normal.      No results found for any visits on 09/08/23.    The 10-year ASCVD risk score (Arnett DK, et al., 2019) is: 0.7%    Assessment & Plan:   Problem List Items Addressed This Visit  Nervous and Auditory   Benign head tremor - Primary   Did discuss that based on her history with no additional neurologic findings it is most likely a benign essential head tremor.  Recent CBC CMP and TSH back in February were okay.  No new medications or supplements.  We discussed referral to neurology to confirm the diagnosis and then also to discuss treatment options which are available for tremor which she is very interested in.      Relevant Orders   Ambulatory referral to Neurology   Other Visit Diagnoses       Elevated liver enzymes       Relevant Orders   CMP14+EGFR       Elevated liver enzymes-encouraged her to completely stop alcohol she says she already has.  Avoid any excess Tylenol  products continue to eat healthy and lets just recheck the enzymes in 2 to 3 weeks.  No follow-ups on file.    Dorothyann Byars, MD

## 2023-09-08 NOTE — Assessment & Plan Note (Signed)
 Did discuss that based on her history with no additional neurologic findings it is most likely a benign essential head tremor.  Recent CBC CMP and TSH back in February were okay.  No new medications or supplements.  We discussed referral to neurology to confirm the diagnosis and then also to discuss treatment options which are available for tremor which she is very interested in.

## 2023-09-09 ENCOUNTER — Encounter: Payer: Self-pay | Admitting: Neurology

## 2023-09-22 NOTE — Discharge Instructions (Signed)

## 2023-09-23 ENCOUNTER — Ambulatory Visit
Admission: RE | Admit: 2023-09-23 | Discharge: 2023-09-23 | Disposition: A | Discharge status: Home / Self Care | Source: Ambulatory Visit | Attending: Sports Medicine | Admitting: Sports Medicine

## 2023-09-23 DIAGNOSIS — M4727 Other spondylosis with radiculopathy, lumbosacral region: Secondary | ICD-10-CM | POA: Diagnosis not present

## 2023-09-23 DIAGNOSIS — M48061 Spinal stenosis, lumbar region without neurogenic claudication: Secondary | ICD-10-CM

## 2023-09-23 MED ORDER — IOPAMIDOL (ISOVUE-M 200) INJECTION 41%
1.0000 mL | Freq: Once | INTRAMUSCULAR | Status: AC
  Administered 2023-09-23: 1 mL via EPIDURAL

## 2023-09-23 MED ORDER — METHYLPREDNISOLONE ACETATE 40 MG/ML INJ SUSP (RADIOLOG
80.0000 mg | Freq: Once | INTRAMUSCULAR | Status: AC
  Administered 2023-09-23: 80 mg via EPIDURAL

## 2023-09-28 NOTE — Progress Notes (Signed)
 Assessment/Plan:  Cervical Dystonia  -I talked to the patient about the nature and pathophysiology.  The patient is having trouble with ADL's and with rotation of the head in daily life for driving.  The primary muscles involved are the R SCM and L Pepin.  We talked about treatments.  We talked about the value of botox.  The patient was educated on the botulinum toxin the black blox warning and given a copy of the botox patient medication guide.  The patient understands that this warning states that there have been reported cases of the Botox extending beyond the injection site and creating adverse effects, similar to those of botulism. This included loss of strength, trouble walking, hoarseness, trouble saying words clearly, loss of bladder control, trouble breathing, trouble swallowing, diplopia, blurry vision and ptosis. Most of the distant spread of Botox was happening in patients, primarily children, who received medication for spasticity or for cervical dystonia. The patient expressed understanding and desire to proceed.  I will try to get authorization.   She has tried failed flexeril  and robaxin , although the robaxin  helps the LBP.     Subjective:   Emily Herring was seen today in neurologic consultation at the request of Alvan Dorothyann BIRCH, *.  The consultation is for the evaluation of head tremor.  Pt with partner who supplements hx.  Medical records made available to me are reviewed.  Patient went to urgent care September 06, 2023 with complaints of head tremor for 3 months, although her partner notes it x 1 year.  She was diagnosed with essential tremor and told to follow-up with primary care, which she did 2 days later. Pt reports that the head tremor actually started back in 2009 when she was try to get pregnant and was placed on ?bromocriptine for small pituatary mass.  She was on it for 3 weeks and tremor was in the no direction.  When she came off the medication it went away.  6 months ago she  noticed that it started coming back.  Dr. Alvan noted that there have been a slight increase in her liver enzymes and she was told to decrease the enzymes and stop the alcohol.  She was also referred here for further evaluation.  She notes that stress increases the tremor.  Its better with head turned to the R.  The neck isn't painful.  It feels like there is a spasm in the L back of the neck.    Last mri brain was in 2018, which was unremarkable.  Neither 2017 nor 2018 MRI brain showed any pituitary mass per report.  She follows with endocrinology through [facility] for her prolactin level.      ALLERGIES:   Allergies  Allergen Reactions   Morphine And Codeine Itching   Percocet [Oxycodone-Acetaminophen ] Itching and Swelling   Amitriptyline  Other (See Comments)    Nightmares   Topiramate  Other (See Comments)    caused her to feel loopy    CURRENT MEDICATIONS:  Outpatient Encounter Medications as of 10/04/2023  Medication Sig   ALPRAZolam  (XANAX ) 0.5 MG tablet Take 0.5-1 tablets (0.25-0.5 mg total) by mouth daily as needed for anxiety.   fluticasone  (FLONASE ) 50 MCG/ACT nasal spray Place into both nostrils daily.   gabapentin  (NEURONTIN ) 300 MG capsule One tab PO qHS for a week, then BID for a week, then TID. May double weekly to a max of 3,600mg /day   loratadine (CLARITIN) 10 MG tablet Take 10 mg by mouth daily.   meloxicam  (  MOBIC ) 15 MG tablet One tab PO every 24 hours with a meal for 2 weeks, then once every 24 hours prn pain.   methocarbamol  (ROBAXIN ) 500 MG tablet Take 1 tablet (500 mg total) by mouth every 8 (eight) hours as needed for muscle spasms.   Miconazole Nitrate (FUNGOID TINCTURE EX) Apply topically.   NON FORMULARY Probiotic Ohhris   NON FORMULARY Mobile Max Blueprint Nutrition   OVER THE COUNTER MEDICATION Protein powder Carnivore   traZODone  (DESYREL ) 50 MG tablet TAKE 1/2 TABLET TO 2 TABLET BY MOUTH AT BEDTIME AS NEEDED FOR SLEEP   valACYclovir  (VALTREX ) 1000 MG  tablet Take 1 tablet (1,000 mg total) by mouth 2 (two) times daily.   No facility-administered encounter medications on file as of 10/04/2023.    Objective:   PHYSICAL EXAMINATION:    VITALS:   Vitals:   10/04/23 1348  BP: 122/84  Pulse: 67  SpO2: 98%  Weight: 154 lb 9.6 oz (70.1 kg)    GEN:  Normal appears female in no acute distress.  Appears stated age. HEENT:  Normocephalic, atraumatic. The mucous membranes are moist. The superficial temporal arteries are without ropiness or tenderness. Cardiovascular: Regular rate and rhythm. Lungs: Clear to auscultation bilaterally. Neck/Heme: There are no carotid bruits noted bilaterally.    NEUROLOGICAL: Orientation:  The patient is alert and oriented x 3.   Cranial nerves: There is good facial symmetry.  Extraocular muscles are intact and visual fields are full to confrontational testing. Speech is fluent and clear. Soft palate rises symmetrically and there is no tongue deviation. Hearing is intact to conversational tone. Tone: Tone is good throughout. Sensation: Sensation is intact to light touch and pinprick throughout (facial, trunk, extremities). Vibration is intact at the bilateral big toe. There is no extinction with double simultaneous stimulation. There is no sensory dermatomal level identified. Coordination:  The patient has no difficulty with RAM's or FNF bilaterally. Motor: Strength is 5/5 in the bilateral upper and lower extremities.  Shoulder shrug is equal and symmetric. There is no pronator drift.  There are no fasciculations noted. DTR's: Deep tendon reflexes are 2/4 at the bilateral biceps, triceps, brachioradialis, patella and achilles.  Plantar responses are downgoing bilaterally. Gait and Station: The patient is able to ambulate without difficulty. The patient is able to heel toe walk without any difficulty. The patient is able to ambulate in a tandem fashion. The patient is able to stand in the Romberg position. Abnormal  movements:  intermittent tremor of the head in the no direction (most evident when she is ambulating in the hall).  No significant hypertrophy of SCM.  Worse with head to the L    Total time spent on today's visit was 45 minutes, including both face-to-face time and nonface-to-face time.  Time included that spent on review of records (prior notes available to me/labs/imaging if pertinent), discussing treatment and goals, answering patient's questions and coordinating care.   Cc:  Alvan Dorothyann BIRCH, MD

## 2023-10-03 DIAGNOSIS — R748 Abnormal levels of other serum enzymes: Secondary | ICD-10-CM | POA: Diagnosis not present

## 2023-10-04 ENCOUNTER — Other Ambulatory Visit: Payer: Self-pay

## 2023-10-04 ENCOUNTER — Encounter: Payer: Self-pay | Admitting: Neurology

## 2023-10-04 ENCOUNTER — Ambulatory Visit: Admitting: Neurology

## 2023-10-04 ENCOUNTER — Telehealth: Payer: Self-pay

## 2023-10-04 ENCOUNTER — Ambulatory Visit: Payer: Self-pay | Admitting: Family Medicine

## 2023-10-04 VITALS — BP 122/84 | HR 67 | Wt 154.6 lb

## 2023-10-04 DIAGNOSIS — G243 Spasmodic torticollis: Secondary | ICD-10-CM | POA: Diagnosis not present

## 2023-10-04 LAB — CMP14+EGFR
ALT: 21 IU/L (ref 0–32)
AST: 19 IU/L (ref 0–40)
Albumin: 4.4 g/dL (ref 3.9–4.9)
Alkaline Phosphatase: 42 IU/L — ABNORMAL LOW (ref 44–121)
BUN/Creatinine Ratio: 16 (ref 9–23)
BUN: 15 mg/dL (ref 6–24)
Bilirubin Total: 0.4 mg/dL (ref 0.0–1.2)
CO2: 21 mmol/L (ref 20–29)
Calcium: 9.3 mg/dL (ref 8.7–10.2)
Chloride: 104 mmol/L (ref 96–106)
Creatinine, Ser: 0.93 mg/dL (ref 0.57–1.00)
Globulin, Total: 2.6 g/dL (ref 1.5–4.5)
Glucose: 77 mg/dL (ref 70–99)
Potassium: 4.2 mmol/L (ref 3.5–5.2)
Sodium: 140 mmol/L (ref 134–144)
Total Protein: 7 g/dL (ref 6.0–8.5)
eGFR: 75 mL/min/1.73 (ref >59)

## 2023-10-04 NOTE — Progress Notes (Signed)
 Hi Franca, great news the liver enzymes are back to normal.  Everything looks great.  Do not worry that the alk phos is a little on the low end.  That is not concerning we are more concerned if it is high.

## 2023-10-04 NOTE — Telephone Encounter (Signed)
 Hi I have a new start up for Xeomin  200 units Cervical Dystonia [phone]

## 2023-10-05 ENCOUNTER — Telehealth: Payer: Self-pay | Admitting: Pharmacy Technician

## 2023-10-05 ENCOUNTER — Other Ambulatory Visit (HOSPITAL_COMMUNITY): Payer: Self-pay

## 2023-10-05 ENCOUNTER — Encounter: Payer: Self-pay | Admitting: Family Medicine

## 2023-10-05 NOTE — Telephone Encounter (Signed)
 PA has been submitted, and telephone encounter has been created. Please see telephone encounter dated 8.6.25.

## 2023-10-05 NOTE — Telephone Encounter (Addendum)
 Pharmacy Patient Advocate Encounter   Received notification from CoverMyMeds that prior authorization for XEOMIN  200 is required/requested.   Insurance verification completed.   The patient is insured through [employer] .   Per test claim: PA required; PA submitted to above mentioned insurance via CoverMyMeds Key/confirmation #/EOC [id number] Status is pending  Dx: G24.3 CPT Code: 35383

## 2023-10-06 ENCOUNTER — Other Ambulatory Visit (HOSPITAL_COMMUNITY): Payer: Self-pay

## 2023-10-06 NOTE — Telephone Encounter (Signed)
 Pharmacy Patient Advocate Encounter- Injection via Medical Benefit:  Pharmacy provided J code: Xeomin - G9411 CPT code: 35383 Dx Code: G24.3  PA was submitted to [facility] and has been approved through: 8.8.25 - 8.7.26 Authorization# [id number]  Please send prescription to [facility]ty [facility]: [phone] Estimated Patient cost is: ?

## 2023-10-07 ENCOUNTER — Other Ambulatory Visit: Payer: Self-pay

## 2023-10-07 DIAGNOSIS — G243 Spasmodic torticollis: Secondary | ICD-10-CM

## 2023-10-07 MED ORDER — XEOMIN 100 UNITS IM SOLR: INTRAMUSCULAR | 2 refills | Status: DC

## 2023-10-07 NOTE — Telephone Encounter (Signed)
 [facility]

## 2023-10-21 ENCOUNTER — Other Ambulatory Visit: Payer: Self-pay | Admitting: Family Medicine

## 2023-10-21 DIAGNOSIS — F411 Generalized anxiety disorder: Secondary | ICD-10-CM

## 2023-10-21 NOTE — Telephone Encounter (Signed)
 Copied from CRM [id number]. Topic: Clinical - Medication Refill >> Oct 21, 2023 12:13 PM Suzette B wrote: Medication: ALPRAZolam  (XANAX ) 0.5 MG tablet  Has the patient contacted their pharmacy? No prescription has not been filled since 09/22/2022 she called the office instead due to the date of last fill   This is the patient's preferred pharmacy:  [id number] [place] [place] [facility] [address] [place] [address] Phone: [phone] Fax: [phone]    Is this the correct pharmacy for this prescription? Yes If no, delete pharmacy and type the correct one.   Has the prescription been filled recently? No 07/24  Is the patient out of the medication? Yes  Has the patient been seen for an appointment in the last year OR does the patient have an upcoming appointment? Yes  Can we respond through MyChart? Yes  Agent: Please be advised that Rx refills may take up to 3 business days. We ask that you follow-up with your pharmacy.

## 2023-10-25 MED ORDER — ALPRAZOLAM 0.5 MG PO TABS: 0.2500 mg | ORAL_TABLET | Freq: Every day | ORAL | 0 refills | Status: AC | PRN

## 2023-10-25 NOTE — Telephone Encounter (Signed)
 Last filled 09/20/2022  Last OV 09/08/2023  Upcoming appointment 11/07/2023

## 2023-11-01 ENCOUNTER — Encounter: Payer: Self-pay | Admitting: Sports Medicine

## 2023-11-07 ENCOUNTER — Ambulatory Visit: Admitting: Sports Medicine

## 2023-11-07 ENCOUNTER — Telehealth: Payer: Self-pay | Admitting: Neurology

## 2023-11-07 DIAGNOSIS — G243 Spasmodic torticollis: Secondary | ICD-10-CM | POA: Diagnosis not present

## 2023-11-07 NOTE — Telephone Encounter (Signed)
 [facility], he needs to have a delivery order update. His name is Emily Herring [phone]

## 2023-11-07 NOTE — Telephone Encounter (Signed)
 Patient needs to be scheduled with Dr. Charles at earliest convenience.

## 2023-11-07 NOTE — Telephone Encounter (Signed)
 [facility]

## 2023-11-11 ENCOUNTER — Ambulatory Visit: Admitting: Neurology

## 2023-11-11 DIAGNOSIS — G243 Spasmodic torticollis: Secondary | ICD-10-CM

## 2023-11-11 MED ORDER — INCOBOTULINUMTOXINA 100 UNITS IM SOLR
200.0000 [IU] | INTRAMUSCULAR | Status: AC
  Administered 2023-11-11: 170 [IU] via INTRAMUSCULAR

## 2023-11-11 NOTE — Procedures (Signed)
 [facility]   Procedure Note Botox  Attending: Dr. Asberry Crystalina Stodghill  Preoperative Diagnosis(es): Cervical Dystonia  Result History  N/a Toxin type: Incobotulinumtoxin A  Consent obtained from: The patient Benefits discussed included, but were not limited to decreased muscle tightness, increased joint range of motion, and decreased pain.  Risk discussed included, but were not limited pain and discomfort, bleeding, bruising, excessive weakness, venous thrombosis, muscle atrophy and dysphagia.  A copy of the patient medication guide was given to the patient which explains the blackbox warning.  Patients identity and treatment sites confirmed Yes.  .  Details of Procedure: Skin was cleaned with alcohol.  A 30 gauge, 25mm  needle was introduced to the target muscle, except for posterior splenius where 27 gauge, 1.5 inch needle used.   Prior to injection, the needle plunger was aspirated to make sure the needle was not within a blood vessel.  There was no blood retrieved on aspiration.    Following is a summary of the muscles injected  And the amount of incobotulinumtoxin A used:   Dilution 0.9% preservative free saline mixed with 100 u incobotulinumtoxin A to make 10 U per 0.1cc  Injections  Location Left  Right Units Number of sites        Sternocleidomastoid  40 40 1  Splenius Capitus, posterior approach 70  70 1  Splenius Capitus, lateral approach 30  30 1   Levator Scapulae      Trapezius 20/10  30         TOTAL UNITS:   170    Agent: Incobotulinumtoxin A.  2 vials of Botox were used, each containing 100 units and freshly diluted with 1 mL of sterile, non-preserved saline   Total injected (Units): 170  Total wasted (Units): 30   Pt tolerated procedure well without complications.   Reinjection is anticipated in 3 months.

## 2023-12-01 ENCOUNTER — Encounter (INDEPENDENT_AMBULATORY_CARE_PROVIDER_SITE_OTHER): Payer: 59 | Admitting: Ophthalmology

## 2023-12-04 ENCOUNTER — Other Ambulatory Visit: Payer: Self-pay | Admitting: Family Medicine

## 2023-12-04 DIAGNOSIS — F5101 Primary insomnia: Secondary | ICD-10-CM

## 2023-12-12 ENCOUNTER — Encounter (INDEPENDENT_AMBULATORY_CARE_PROVIDER_SITE_OTHER): Payer: 59 | Admitting: Ophthalmology

## 2023-12-12 DIAGNOSIS — H43813 Vitreous degeneration, bilateral: Secondary | ICD-10-CM | POA: Diagnosis not present

## 2023-12-12 DIAGNOSIS — H2513 Age-related nuclear cataract, bilateral: Secondary | ICD-10-CM

## 2023-12-12 DIAGNOSIS — H33303 Unspecified retinal break, bilateral: Secondary | ICD-10-CM | POA: Diagnosis not present

## 2023-12-26 ENCOUNTER — Ambulatory Visit

## 2023-12-26 VITALS — BP 100/70 | Ht 70.0 in | Wt 154.0 lb

## 2023-12-26 DIAGNOSIS — M48061 Spinal stenosis, lumbar region without neurogenic claudication: Secondary | ICD-10-CM | POA: Diagnosis not present

## 2023-12-26 NOTE — Progress Notes (Signed)
   Subjective:    Patient ID: Emily Herring, female    DOB: 48 y.o., 1975/12/20   MRN: [id number]  Chief Complaint: Low back pain  Discussed the use of AI scribe software for clinical note transcription with the patient, who gave verbal consent to proceed.  History of Present Illness Emily Herring is a 48 year old female with low back pain and cervical dystonia who presents with persistent pain and limited mobility. She is seeing Dr. Asberry Silvius for cervical dystonia.  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 - Significant impact on ability to engage in physical activities such as running and sports - Two epidural steroid injections provided temporary relief; last injection on October 03, 2023 - Physical therapy for six months last year without significant relief - Chiropractic care, including decompression therapy, has helped regain some mobility and improved walking comfort - Continued significant pain and limitations in daily activities  Cervical dystonia and associated symptoms - Cervical dystonia with head shaking - Botox treatment in September 2025 without improvement - Persistent limitation in mobility and daily function due to dystonia  Functional limitations and physical activity - Engages in walking and resistance training - Decline in cardiovascular fitness - Feels too young to be so limited in activities  Review of pertinent imaging: 04/03/2023 MRI of lumbar spine revealing lumbar scoliotic curve with apex at L3.  L3-4 disc  degeneration most prominent on the right with neuroforaminal stenosis.  L4-5 disc bulging with neuroforaminal stenosis more prominent on the left.     Objective:   Vitals:   12/26/23 1519  BP: 100/70    Const: appears well, non-toxic, well groomed Psych: affect bright, interactive, smiling EENT: EOMI intact, conjunctiva appear normal Neck: no obvious masses, appears symmetric Resp: non-labored, appears symmetric Neuro: muscle bulk appears normal Skin: no obvious rashes noted  I did not repeat Gwendloyn's back exam today.    Assessment & Plan:   Assessment & Plan Chronic lumbar radiculopathy with intervertebral disc bulge at L3-L4 and L4-L5   Chronic lumbar radiculopathy with an intervertebral disc bulge at L4-L5 mostly on the right and L3-L4 on the left side causes significant pain and functional impairment. She experiences sharp stabbing pain, radiating nerve pain down both legs, and severe limitations in physical activity. Previous epidural steroid injections provided temporary relief. Refer to a spine specialist for further evaluation and potential surgical options, considering her scoliotic curve's impact on treatment outcomes. Order an epidural steroid injection at L4-L5, the same level as previous injections, and schedule a series of three injections to maximize benefit. Consider her preference for early intervention if surgery is deemed beneficial.

## 2024-01-02 ENCOUNTER — Telehealth: Payer: Self-pay | Admitting: *Deleted

## 2024-01-02 ENCOUNTER — Other Ambulatory Visit: Payer: Self-pay

## 2024-01-02 DIAGNOSIS — M545 Low back pain, unspecified: Secondary | ICD-10-CM

## 2024-01-02 DIAGNOSIS — M48061 Spinal stenosis, lumbar region without neurogenic claudication: Secondary | ICD-10-CM

## 2024-01-02 MED ORDER — GABAPENTIN 300 MG PO CAPS: ORAL_CAPSULE | ORAL | 3 refills | Status: AC

## 2024-01-02 MED ORDER — METHOCARBAMOL 500 MG PO TABS: 500.0000 mg | ORAL_TABLET | Freq: Three times a day (TID) | ORAL | 5 refills | Status: AC | PRN

## 2024-01-02 NOTE — Telephone Encounter (Signed)
 Referral location updated and faxed to a [place] location. See referrals.

## 2024-01-02 NOTE — Telephone Encounter (Signed)
-----   Message from Alisa DEL sent at 01/02/2024  4:07 PM EST ----- Regarding: Pt says she doesn't want to drive to [place] to see Spine specialist/Referral Pt cld says pls find a Spine Specialist locally in [place] or [place].. she doesn't want to drive to 45min to [place].  Pls & Thx U

## 2024-01-02 NOTE — Addendum Note (Signed)
 Addended by: MARCINE HARLENE SAILOR on: 01/02/2024 04:42 PM   Modules accepted: Orders

## 2024-01-03 NOTE — Progress Notes (Unsigned)
 Assessment/Plan:   Cervical dystonia  - Status post initial Xeomin  injections on November 11, 2023.  Didn't find them particularly helpful.  Suspect that the dose was too low, and that I will probably need to add in levator scapula on the right.  - She asks what she can do until next injections.  After some discussion, we decided to add in low-dose clonazepam.  Discussed r/b/se.  She will start with half tablet at bedtime for a week or 2 and then she can trial half tablet twice per day.  PDMP reviewed.    Subjective:   Emily Herring was seen today in follow up for cervical dystonia.  My previous records as well as any outside records available were reviewed prior to todays visit.  Pt had first set of Xeomin  injections on November 11, 2023.  She found no benefit from the medication and actually got HA for a week and felt that she had anxiety with the medication.      CURRENT MEDICATIONS:  Outpatient Encounter Medications as of 01/06/2024  Medication Sig   ALPRAZolam  (XANAX ) 0.5 MG tablet Take 0.5-1 tablets (0.25-0.5 mg total) by mouth daily as needed for anxiety.   clonazePAM (KLONOPIN) 0.5 MG tablet Take 0.5 tablets (0.25 mg total) by mouth 2 (two) times daily.   fluticasone  (FLONASE ) 50 MCG/ACT nasal spray Place into both nostrils daily.   gabapentin  (NEURONTIN ) 300 MG capsule One tab PO qHS for a week, then BID for a week, then TID. May double weekly to a max of 3,600mg /day (Patient taking differently: Currently taken 300mg  bid)   loratadine (CLARITIN) 10 MG tablet Take 10 mg by mouth daily.   meloxicam  (MOBIC ) 15 MG tablet One tab PO every 24 hours with a meal for 2 weeks, then once every 24 hours prn pain.   methocarbamol  (ROBAXIN ) 500 MG tablet Take 1 tablet (500 mg total) by mouth every 8 (eight) hours as needed for muscle spasms.   Miconazole Nitrate (FUNGOID TINCTURE EX) Apply topically.   OVER THE COUNTER MEDICATION Protein powder Carnivore   traZODone  (DESYREL ) 50 MG tablet  TAKE 1/2 TABLET TO 2 TABLET BY MOUTH AT BEDTIME AS NEEDED FOR SLEEP   valACYclovir  (VALTREX ) 1000 MG tablet Take 1 tablet (1,000 mg total) by mouth 2 (two) times daily.   incobotulinumtoxinA  (XEOMIN ) 100 units SOLR injection INJECT 200 UNITS INTO PATIENTS HEAD AND NECK EVERY 90 DAYS PER DR. Willson Lipa (Patient not taking: Reported on 01/06/2024)   NON FORMULARY Probiotic Ohhris   NON FORMULARY Mobile Max Blueprint Nutrition   Facility-Administered Encounter Medications as of 01/06/2024  Medication   incobotulinumtoxinA  (XEOMIN ) 100 units injection 200 Units     Objective:   PHYSICAL EXAMINATION:    VITALS:   Vitals:   01/06/24 0753  BP: 127/79  Pulse: 69  Resp: 20  SpO2: 99%  Weight: 162 lb (73.5 kg)  Height: 5' 10 (1.778 m)    GEN:  The patient appears stated age and is in NAD. HEENT:  Normocephalic, atraumatic.  The mucous membranes are moist. The superficial temporal arteries are without ropiness or tenderness. Abnormal movements: intermittent tremor of the head in the no direction (most evident when she is looking at phone and texting but also when R ear is pointed to R shoulder). No significant hypertrophy of SCM. Worse with head to the L   Neurological examination:  Orientation: The patient is alert and oriented x3. Cranial nerves: There is good facial symmetry.The speech is fluent and clear. Soft  palate rises symmetrically and there is no tongue deviation. Hearing is intact to conversational tone. Sensation: Sensation is intact to light touch throughout Motor: Strength is at least antigravity x4.  Total time spent on today's visit was 30 minutes, including both face-to-face time and nonface-to-face time.  Time included that spent on review of records (prior notes available to me/labs/imaging if pertinent), discussing treatment and goals, answering patient's questions and coordinating care.   Cc:  Alvan Dorothyann BIRCH, MD

## 2024-01-06 ENCOUNTER — Encounter: Payer: Self-pay | Admitting: Neurology

## 2024-01-06 ENCOUNTER — Other Ambulatory Visit: Payer: Self-pay

## 2024-01-06 ENCOUNTER — Telehealth: Payer: Self-pay

## 2024-01-06 ENCOUNTER — Other Ambulatory Visit (HOSPITAL_COMMUNITY): Payer: Self-pay

## 2024-01-06 ENCOUNTER — Ambulatory Visit: Admitting: Neurology

## 2024-01-06 VITALS — BP 127/79 | HR 69 | Resp 20 | Ht 70.0 in | Wt 162.0 lb

## 2024-01-06 DIAGNOSIS — G243 Spasmodic torticollis: Secondary | ICD-10-CM

## 2024-01-06 MED ORDER — XEOMIN 100 UNITS IM SOLR: INTRAMUSCULAR | 2 refills | Status: DC

## 2024-01-06 MED ORDER — CLONAZEPAM 0.5 MG PO TABS
0.2500 mg | ORAL_TABLET | Freq: Two times a day (BID) | ORAL | 0 refills | Status: AC
Start: 2024-01-06 — End: ?

## 2024-01-06 NOTE — Telephone Encounter (Signed)
 Dr. Evonnie would like to go up on this patients dose to 300 units of Xeomin  for code 35383. Do I need an new PA for this change?

## 2024-01-06 NOTE — Telephone Encounter (Signed)
 New PA would not be needed as the PA covers the medication and calendar year, not specific to the quantity or number of units.

## 2024-01-06 NOTE — Patient Instructions (Signed)
 Start clonazepam 0.5 mg, 1/2 tablet at bed for 1-2 weeks and then 1/2 tablet twice per day

## 2024-01-11 DIAGNOSIS — L578 Other skin changes due to chronic exposure to nonionizing radiation: Secondary | ICD-10-CM | POA: Diagnosis not present

## 2024-01-11 DIAGNOSIS — L2389 Allergic contact dermatitis due to other agents: Secondary | ICD-10-CM | POA: Diagnosis not present

## 2024-01-11 DIAGNOSIS — L570 Actinic keratosis: Secondary | ICD-10-CM | POA: Diagnosis not present

## 2024-01-11 DIAGNOSIS — L821 Other seborrheic keratosis: Secondary | ICD-10-CM | POA: Diagnosis not present

## 2024-01-11 DIAGNOSIS — D2261 Melanocytic nevi of right upper limb, including shoulder: Secondary | ICD-10-CM | POA: Diagnosis not present

## 2024-01-11 DIAGNOSIS — Z86018 Personal history of other benign neoplasm: Secondary | ICD-10-CM | POA: Diagnosis not present

## 2024-01-11 DIAGNOSIS — D225 Melanocytic nevi of trunk: Secondary | ICD-10-CM | POA: Diagnosis not present

## 2024-01-11 DIAGNOSIS — D2262 Melanocytic nevi of left upper limb, including shoulder: Secondary | ICD-10-CM | POA: Diagnosis not present

## 2024-01-11 DIAGNOSIS — L814 Other melanin hyperpigmentation: Secondary | ICD-10-CM | POA: Diagnosis not present

## 2024-01-16 NOTE — Addendum Note (Signed)
 Addended by: MARCINE HARLENE SAILOR on: 01/16/2024 01:32 PM   Modules accepted: Orders

## 2024-01-23 ENCOUNTER — Other Ambulatory Visit: Payer: Self-pay

## 2024-01-23 DIAGNOSIS — G243 Spasmodic torticollis: Secondary | ICD-10-CM

## 2024-01-23 MED ORDER — XEOMIN 100 UNITS IM SOLR: INTRAMUSCULAR | 2 refills | Status: AC

## 2024-02-03 NOTE — Discharge Instructions (Signed)

## 2024-02-06 ENCOUNTER — Inpatient Hospital Stay: Admission: RE | Admit: 2024-02-06 | Discharge: 2024-02-06 | Disposition: A | Source: Ambulatory Visit

## 2024-02-06 DIAGNOSIS — M48061 Spinal stenosis, lumbar region without neurogenic claudication: Secondary | ICD-10-CM

## 2024-02-06 DIAGNOSIS — M4727 Other spondylosis with radiculopathy, lumbosacral region: Secondary | ICD-10-CM | POA: Diagnosis not present

## 2024-02-06 MED ORDER — METHYLPREDNISOLONE ACETATE 40 MG/ML INJ SUSP (RADIOLOG
80.0000 mg | Freq: Once | INTRAMUSCULAR | Status: AC
  Administered 2024-02-06: 80 mg via EPIDURAL

## 2024-02-06 MED ORDER — IOPAMIDOL (ISOVUE-M 200) INJECTION 41%
1.0000 mL | Freq: Once | INTRAMUSCULAR | Status: AC
  Administered 2024-02-06: 1 mL via EPIDURAL

## 2024-02-08 DIAGNOSIS — G243 Spasmodic torticollis: Secondary | ICD-10-CM | POA: Diagnosis not present

## 2024-02-10 ENCOUNTER — Ambulatory Visit: Admitting: Neurology

## 2024-02-10 DIAGNOSIS — G243 Spasmodic torticollis: Secondary | ICD-10-CM | POA: Diagnosis not present

## 2024-02-10 MED ORDER — INCOBOTULINUMTOXINA 100 UNITS IM SOLR
300.0000 [IU] | INTRAMUSCULAR | Status: AC
  Administered 2024-02-10: 280 [IU] via INTRAMUSCULAR

## 2024-02-10 NOTE — Procedures (Signed)
 [facility]   Procedure Note Botox  Attending: Dr. Asberry Hasset Chaviano  Preoperative Diagnosis(es): Cervical Dystonia  Result History  Last injections not helpful  Toxin type: Incobotulinumtoxin A  Consent obtained from: The patient Benefits discussed included, but were not limited to decreased muscle tightness, increased joint range of motion, and decreased pain.  Risk discussed included, but were not limited pain and discomfort, bleeding, bruising, excessive weakness, venous thrombosis, muscle atrophy and dysphagia.  A copy of the patient medication guide was given to the patient which explains the blackbox warning.  Patients identity and treatment sites confirmed Yes.  .  Details of Procedure: Skin was cleaned with alcohol.  A 30 gauge, 25mm  needle was introduced to the target muscle, except for posterior splenius where 27 gauge, 1.5 inch needle used.   Prior to injection, the needle plunger was aspirated to make sure the needle was not within a blood vessel.  There was no blood retrieved on aspiration.    Following is a summary of the muscles injected  And the amount of incobotulinumtoxin A used:   Dilution 0.9% preservative free saline mixed with 100 u incobotulinumtoxin A to make 10 U per 0.1cc  Injections  Location Left  Right Units Number of sites        Sternocleidomastoid  40/20/10 70 3  Splenius Capitus, posterior approach 100  100 1  Splenius Capitus, lateral approach 30  30 1   Levator Scapulae 20  20   Semispinalis capitus 20  20   trapezius 10 20/10 40   TOTAL UNITS:   280    Agent: Incobotulinumtoxin A.  3 vials of incobotulinumtoxin A were used, each containing 100 units and freshly diluted with 1 mL of sterile, non-preserved saline   Total injected (Units): 280  Total wasted (Units): 20   Pt tolerated procedure well without complications.   Reinjection is anticipated in 3 months.

## 2024-03-28 ENCOUNTER — Other Ambulatory Visit: Payer: Self-pay | Admitting: Family Medicine

## 2024-03-28 DIAGNOSIS — F5101 Primary insomnia: Secondary | ICD-10-CM

## 2024-05-11 ENCOUNTER — Ambulatory Visit: Admitting: Neurology

## 2024-12-13 ENCOUNTER — Encounter (INDEPENDENT_AMBULATORY_CARE_PROVIDER_SITE_OTHER): Admitting: Ophthalmology
